# Patient Record
Sex: Female | Born: 1940 | Race: White | Hispanic: No | Marital: Single | Smoking: Never smoker
Health system: Southern US, Community
[De-identification: ages and names within clinical notes are randomized; demographics above are authoritative.]

## PROBLEM LIST (undated history)

## (undated) DIAGNOSIS — I10 Essential (primary) hypertension: Secondary | ICD-10-CM

## (undated) DIAGNOSIS — E119 Type 2 diabetes mellitus without complications: Secondary | ICD-10-CM

## (undated) DIAGNOSIS — E785 Hyperlipidemia, unspecified: Secondary | ICD-10-CM

## (undated) DIAGNOSIS — K5120 Ulcerative (chronic) proctitis without complications: Secondary | ICD-10-CM

## (undated) DIAGNOSIS — C801 Malignant (primary) neoplasm, unspecified: Secondary | ICD-10-CM

## (undated) HISTORY — PX: ABDOMINAL HYSTERECTOMY: SHX81

## (undated) HISTORY — DX: Type 2 diabetes mellitus without complications: E11.9

## (undated) HISTORY — PX: TONSILLECTOMY AND ADENOIDECTOMY: SUR1326

## (undated) HISTORY — DX: Malignant (primary) neoplasm, unspecified: C80.1

## (undated) HISTORY — DX: Hyperlipidemia, unspecified: E78.5

## (undated) HISTORY — DX: Ulcerative (chronic) proctitis without complications: K51.20

## (undated) HISTORY — DX: Essential (primary) hypertension: I10

---

## 2013-11-11 DIAGNOSIS — C801 Malignant (primary) neoplasm, unspecified: Secondary | ICD-10-CM

## 2013-11-11 HISTORY — DX: Malignant (primary) neoplasm, unspecified: C80.1

## 2016-11-21 LAB — BASIC METABOLIC PANEL
BUN: 10 (ref 4–21)
Creatinine: 0.6 (ref 0.5–1.1)
GLUCOSE: 122
Potassium: 3.9 (ref 3.4–5.3)
SODIUM: 128 — AB (ref 137–147)

## 2016-11-21 LAB — HEPATIC FUNCTION PANEL
ALK PHOS: 67 (ref 25–125)
ALT: 17 (ref 7–35)
AST: 23 (ref 13–35)
BILIRUBIN, TOTAL: 0.3

## 2016-11-21 LAB — CBC AND DIFFERENTIAL
HEMATOCRIT: 40 (ref 36–46)
HEMOGLOBIN: 13.4 (ref 12.0–16.0)
Platelets: 215 (ref 150–399)
WBC: 28.3

## 2017-06-11 ENCOUNTER — Ambulatory Visit (INDEPENDENT_AMBULATORY_CARE_PROVIDER_SITE_OTHER): Payer: Medicare PPO | Admitting: Family Medicine

## 2017-06-11 ENCOUNTER — Encounter: Payer: Self-pay | Admitting: Family Medicine

## 2017-06-11 VITALS — BP 129/77 | HR 65 | Ht 64.5 in | Wt 166.0 lb

## 2017-06-11 DIAGNOSIS — K219 Gastro-esophageal reflux disease without esophagitis: Secondary | ICD-10-CM | POA: Diagnosis not present

## 2017-06-11 DIAGNOSIS — E1169 Type 2 diabetes mellitus with other specified complication: Secondary | ICD-10-CM | POA: Insufficient documentation

## 2017-06-11 DIAGNOSIS — C9110 Chronic lymphocytic leukemia of B-cell type not having achieved remission: Secondary | ICD-10-CM

## 2017-06-11 DIAGNOSIS — K635 Polyp of colon: Secondary | ICD-10-CM | POA: Diagnosis not present

## 2017-06-11 DIAGNOSIS — K5190 Ulcerative colitis, unspecified, without complications: Secondary | ICD-10-CM | POA: Insufficient documentation

## 2017-06-11 DIAGNOSIS — Z90710 Acquired absence of both cervix and uterus: Secondary | ICD-10-CM | POA: Diagnosis not present

## 2017-06-11 DIAGNOSIS — K51019 Ulcerative (chronic) pancolitis with unspecified complications: Secondary | ICD-10-CM

## 2017-06-11 DIAGNOSIS — I10 Essential (primary) hypertension: Secondary | ICD-10-CM | POA: Diagnosis not present

## 2017-06-11 DIAGNOSIS — Z1159 Encounter for screening for other viral diseases: Secondary | ICD-10-CM

## 2017-06-11 DIAGNOSIS — E119 Type 2 diabetes mellitus without complications: Secondary | ICD-10-CM | POA: Diagnosis not present

## 2017-06-11 DIAGNOSIS — Z139 Encounter for screening, unspecified: Secondary | ICD-10-CM | POA: Diagnosis not present

## 2017-06-11 DIAGNOSIS — C9190 Lymphoid leukemia, unspecified not having achieved remission: Secondary | ICD-10-CM | POA: Diagnosis not present

## 2017-06-11 DIAGNOSIS — E782 Mixed hyperlipidemia: Secondary | ICD-10-CM | POA: Diagnosis not present

## 2017-06-11 DIAGNOSIS — R5383 Other fatigue: Secondary | ICD-10-CM | POA: Diagnosis not present

## 2017-06-11 DIAGNOSIS — Z114 Encounter for screening for human immunodeficiency virus [HIV]: Secondary | ICD-10-CM

## 2017-06-11 DIAGNOSIS — E785 Hyperlipidemia, unspecified: Secondary | ICD-10-CM

## 2017-06-11 DIAGNOSIS — E1159 Type 2 diabetes mellitus with other circulatory complications: Secondary | ICD-10-CM | POA: Insufficient documentation

## 2017-06-11 DIAGNOSIS — Z13820 Encounter for screening for osteoporosis: Secondary | ICD-10-CM

## 2017-06-11 MED ORDER — METFORMIN HCL 500 MG PO TABS: 500.0000 mg | ORAL_TABLET | Freq: Two times a day (BID) | ORAL | 0 refills | Status: DC

## 2017-06-11 NOTE — Patient Instructions (Addendum)
Please call your medical insurance and find out which glucometer they recommend you used to check her diabetes.  And then please let us know next time you come in for fasting labs and then the medical assistant can call in the prescription for that glucometer for you.   Come in in the near future for fasting blood work.  If you would like, you can make a follow-up with me a week or so afterwards to discuss the results and this is the first time you or reviewing blood work with me.      Diabetes Mellitus and Standards of Medical Care Managing diabetes (diabetes mellitus) can be complicated. Your diabetes treatment may be managed by a team of health care providers, including:  A diet and nutrition specialist (registered dietitian).  A nurse.  A certified diabetes educator (CDE).  A diabetes specialist (endocrinologist).  An eye doctor.  A primary care provider.  A dentist.  Your health care providers follow a schedule in order to help you get the best quality of care. The following schedule is a general guideline for your diabetes management plan. Your health care providers may also give you more specific instructions. HbA1c ( hemoglobin A1c) test This test provides information about blood sugar (glucose) control over the previous 2-3 months. It is used to check whether your diabetes management plan needs to be adjusted.  If you are meeting your treatment goals, this test is done at least 2 times a year.  If you are not meeting treatment goals or if your treatment goals have changed, this test is done 4 times a year.  Blood pressure test  This test is done at every routine medical visit. For most people, the goal is less than 130/80. Ask your health care provider what your goal blood pressure should be. Dental and eye exams  Visit your dentist two times a year.  If you have type 1 diabetes, get an eye exam 3-5 years after you are diagnosed, and then once a year after your  first exam. ? If you were diagnosed with type 1 diabetes as a child, get an eye exam when you are age 8 or older and have had diabetes for 3-5 years. After the first exam, you should get an eye exam once a year.  If you have type 2 diabetes, have an eye exam as soon as you are diagnosed, and then once a year after your first exam. Foot care exam  Visual foot exams are done at every routine medical visit. The exams check for cuts, bruises, redness, blisters, sores, or other problems with the feet.  A complete foot exam is done by your health care provider once a year. This exam includes an inspection of the structure and skin of your feet, and a check of the pulses and sensation in your feet. ? Type 1 diabetes: Get your first exam 3-5 years after diagnosis. ? Type 2 diabetes: Get your first exam as soon as you are diagnosed.  Check your feet every day for cuts, bruises, redness, blisters, or sores. If you have any of these or other problems that are not healing, contact your health care provider. Kidney function test ( urine microalbumin)  This test is done once a year. ? Type 1 diabetes: Get your first test 5 years after diagnosis. ? Type 2 diabetes: Get your first test as soon as you are diagnosed.  If you have chronic kidney disease (CKD), get a serum creatinine and estimated glomerular  filtration rate (eGFR) test once a year. Lipid profile (cholesterol, HDL, LDL, triglycerides)  This test should be done when you are diagnosed with diabetes, and every 5 years after the first test. If you are on medicines to lower your cholesterol, you may need to get this test done every year. ? The goal for LDL is less than 100 mg/dL (5.5 mmol/L). If you are at high risk, the goal is less than 70 mg/dL (3.9 mmol/L). ? The goal for HDL is 40 mg/dL (2.2 mmol/L) for men and 50 mg/dL(2.8 mmol/L) for women. An HDL cholesterol of 60 mg/dL (3.3 mmol/L) or higher gives some protection against heart  disease. ? The goal for triglycerides is less than 150 mg/dL (8.3 mmol/L). Immunizations  The yearly flu (influenza) vaccine is recommended for everyone 6 months or older who has diabetes.  The pneumonia (pneumococcal) vaccine is recommended for everyone 2 years or older who has diabetes. If you are 92 or older, you may get the pneumonia vaccine as a series of two separate shots.  The hepatitis B vaccine is recommended for adults shortly after they have been diagnosed with diabetes.  The Tdap (tetanus, diphtheria, and pertussis) vaccine should be given: ? According to normal childhood vaccination schedules, for children. ? Every 10 years, for adults who have diabetes.  The shingles vaccine is recommended for people who have had chicken pox and are 50 years or older. Mental and emotional health  Screening for symptoms of eating disorders, anxiety, and depression is recommended at the time of diagnosis and afterward as needed. If your screening shows that you have symptoms (you have a positive screening result), you may need further evaluation and be referred to a mental health care provider. Diabetes self-management education  Education about how to manage your diabetes is recommended at diagnosis and ongoing as needed. Treatment plan  Your treatment plan will be reviewed at every medical visit. Summary  Managing diabetes (diabetes mellitus) can be complicated. Your diabetes treatment may be managed by a team of health care providers.  Your health care providers follow a schedule in order to help you get the best quality of care.  Standards of care including having regular physical exams, blood tests, blood pressure monitoring, immunizations, screening tests, and education about how to manage your diabetes.  Your health care providers may also give you more specific instructions based on your individual health. This information is not intended to replace advice given to you by your  health care provider. Make sure you discuss any questions you have with your health care provider. Document Released: 08/25/2009 Document Revised: 07/26/2016 Document Reviewed: 07/26/2016 Elsevier Interactive Patient Education  2018 [place].      Blood Glucose Monitoring, Adult Monitoring your blood sugar (glucose) helps you manage your diabetes. It also helps you and your health care provider determine how well your diabetes management plan is working. Blood glucose monitoring involves checking your blood glucose as often as directed, and keeping a record (log) of your results over time. Why should I monitor my blood glucose? Checking your blood glucose regularly can:  Help you understand how food, exercise, illnesses, and medicines affect your blood glucose.  Let you know what your blood glucose is at any time. You can quickly tell if you are having low blood glucose (hypoglycemia) or high blood glucose (hyperglycemia).  Help you and your health care provider adjust your medicines as needed.  When should I check my blood glucose? Follow instructions from your health  care provider about how often to check your blood glucose. This may depend on:  The type of diabetes you have.  How well-controlled your diabetes is.  Medicines you are taking.  If you have type 1 diabetes:  Check your blood glucose at least 2 times a day.  Also check your blood glucose: ? Before every insulin injection. ? Before and after exercise. ? Between meals. ? 2 hours after a meal. ? Occasionally between 2:00 a.m. and 3:00 a.m., as directed. ? Before potentially dangerous tasks, like driving or using heavy machinery. ? At bedtime.  You may need to check your blood glucose more often, up to 6-10 times a day: ? If you use an insulin pump. ? If you need multiple daily injections (MDI). ? If your diabetes is not well-controlled. ? If you are ill. ? If you have a history of severe  hypoglycemia. ? If you have a history of not knowing when your blood glucose is getting low (hypoglycemia unawareness). If you have type 2 diabetes:  If you take insulin or other diabetes medicines, check your blood glucose at least 2 times a day.  If you are on intensive insulin therapy, check your blood glucose at least 4 times a day. Occasionally, you may also need to check between 2:00 a.m. and 3:00 a.m., as directed.  Also check your blood glucose: ? Before and after exercise. ? Before potentially dangerous tasks, like driving or using heavy machinery.  You may need to check your blood glucose more often if: ? Your medicine is being adjusted. ? Your diabetes is not well-controlled. ? You are ill. What is a blood glucose log?  A blood glucose log is a record of your blood glucose readings. It helps you and your health care provider: ? Look for patterns in your blood glucose over time. ? Adjust your diabetes management plan as needed.  Every time you check your blood glucose, write down your result and notes about things that may be affecting your blood glucose, such as your diet and exercise for the day.  Most glucose meters store a record of glucose readings in the meter. Some meters allow you to download your records to a computer. How do I check my blood glucose? Follow these steps to get accurate readings of your blood glucose: Supplies needed   Blood glucose meter.  Test strips for your meter. Each meter has its own strips. You must use the strips that come with your meter.  A needle to prick your finger (lancet). Do not use lancets more than once.  A device that holds the lancet (lancing device).  A journal or log book to write down your results. Procedure  Wash your hands with soap and water.  Prick the side of your finger (not the tip) with the lancet. Use a different finger each time.  Gently rub the finger until a small drop of blood appears.  Follow  instructions that come with your meter for inserting the test strip, applying blood to the strip, and using your blood glucose meter.  Write down your result and any notes. Alternative testing sites  Some meters allow you to use areas of your body other than your finger (alternative sites) to test your blood.  If you think you may have hypoglycemia, or if you have hypoglycemia unawareness, do not use alternative sites. Use your finger instead.  Alternative sites may not be as accurate as the fingers, because blood flow is slower in these  areas. This means that the result you get may be delayed, and it may be different from the result that you would get from your finger.  The most common alternative sites are: ? Forearm. ? Thigh. ? Palm of the hand. Additional tips  Always keep your supplies with you.  If you have questions or need help, all blood glucose meters have a 24-hour "hotline" number that you can call. You may also contact your health care provider.  After you use a few boxes of test strips, adjust (calibrate) your blood glucose meter by following instructions that came with your meter. This information is not intended to replace advice given to you by your health care provider. Make sure you discuss any questions you have with your health care provider. Document Released: 10/31/2003 Document Revised: 05/17/2016 Document Reviewed: 04/08/2016 Elsevier Interactive Patient Education  2017 [employer].

## 2017-06-11 NOTE — Progress Notes (Signed)
New patient office visit note:  Impression and Recommendations:    1. Encounter for medical screening examination   2. Diabetes mellitus without complication ([place])   3. Hypertension, unspecified type   4. Mixed hyperlipidemia   5. CLL (chronic lymphocytic leukemia) ([place])   6. Ulcerative pancolitis with complication ([place])   7. Polyp of colon, unspecified part of colon, unspecified type   8. Gastroesophageal reflux disease, esophagitis presence not specified   9. S/P hysterectomy- about 11yr ago   10. Osteoporosis screening   11. Other fatigue   12. Need for hepatitis C screening test   13. Screening for HIV without presence of risk factors      No problem-specific Assessment & Plan notes found for this encounter.   The patient was counseled, risk factors were discussed, anticipatory guidance given.   New Prescriptions   ALCOHOL SWABS (B-D SINGLE USE SWABS REGULAR) PADS    1 Package by Does not apply route 2 (two) times daily.   BLOOD GLUCOSE CALIBRATION (ACCU-CHEK AVIVA) SOLN    1 drop by In Vitro route 2 (two) times daily.   BLOOD GLUCOSE MONITORING SUPPL (ACCU-CHEK AVIVA) DEVICE    Use as instructed   CALCIUM CARBONATE-VITAMIN D 600-400 MG-UNIT TABLET    Take 1 tablet by mouth 2 (two) times daily. ( take 1200 calcium per day)   CHOLECALCIFEROL (VITAMIN D3) 5000 UNITS TABS    5,000 IU OTC vitamin D3 daily.   GLUCOSE BLOOD (ACCU-CHEK AVIVA) TEST STRIP    Use as instructed   LANCET DEVICE MISC    1 Device by Does not apply route 2 (two) times daily.   VITAMIN D, ERGOCALCIFEROL, (DRISDOL) 50000 UNITS CAPS CAPSULE    Take 1 capsule (50,000 Units total) by mouth every 7 (seven) days.    Meds ordered this encounter  Medications  . DISCONTD: metFORMIN (GLUCOPHAGE) 500 MG tablet    Sig: Take 1 tablet (500 mg total) by mouth 2 (two) times daily with a meal.    Dispense:  60 tablet    Refill:  0  . DISCONTD: metFORMIN (GLUCOPHAGE) 500 MG tablet    Sig: Take 1 tablet (500 mg  total) by mouth 2 (two) times daily with a meal.    Dispense:  60 tablet    Refill:  0    Discontinued Medications   METFORMIN (GLUCOPHAGE) 500 MG TABLET    Take 500 mg by mouth 2 (two) times daily with a meal.    Modified Medications   Modified Medication Previous Medication   ATORVASTATIN (LIPITOR) 10 MG TABLET atorvastatin (LIPITOR) 10 MG tablet      Take 1 tablet (10 mg total) by mouth daily.    Take 10 mg by mouth daily.   HYDROCHLOROTHIAZIDE (HYDRODIURIL) 25 MG TABLET hydrochlorothiazide (HYDRODIURIL) 25 MG tablet      Take 1 tablet (25 mg total) by mouth daily.    Take 25 mg by mouth daily.   LOSARTAN (COZAAR) 100 MG TABLET losartan (COZAAR) 100 MG tablet      Take 1 tablet (100 mg total) by mouth daily.    Take 100 mg by mouth daily.   METFORMIN (GLUCOPHAGE) 500 MG TABLET metFORMIN (GLUCOPHAGE) 500 MG tablet      Take 0.5 tablets (250 mg total) by mouth 2 (two) times daily with a meal. Need OV prior to further RF    Take 0.5 tablets (250 mg total) by mouth 2 (two) times daily with a meal.  Orders Placed This Encounter  Procedures  . CBC with Differential/Platelet  . Comprehensive metabolic panel  . Hemoglobin A1c  . Hepatitis C antibody  . HIV antibody  . Lipid panel  . T4, free  . TSH  . VITAMIN D 25 Hydroxy (Vit-D Deficiency, Fractures)  . Ambulatory referral to Hematology / Oncology  . Ambulatory referral to Gastroenterology     Gross side effects, risk and benefits, and alternatives of medications discussed with patient.  Patient is aware that all medications have potential side effects and we are unable to predict every side effect or drug-drug interaction that may occur.  Expresses verbal understanding and consents to current therapy plan and treatment regimen.  Return for bldwrk near future; then f/up OV to discuss per pt prefer.  Please see AVS handed out to patient at the end of our visit for further patient instructions/ counseling done pertaining to  today's office visit.    Note: This document was prepared using Dragon voice recognition software and may include unintentional dictation errors.  ----------------------------------------------------------------------------------------------------------------------    Subjective:    Chief complaint:   Chief Complaint  Patient presents with  . Establish Care     HPI: Mackenzie Barber is a pleasant 76 y.o. female who presents to [facility] today to review their medical history with me and establish care.   I asked the patient to review their chronic problem list with me to ensure everything was updated and accurate.    All recent office visits with other providers, any medical records that patient brought in etc  - I reviewed today.     Also asked pt to get me medical records from [facility] providers/ specialists that they had seen within the past 3-5 years- if they are in private practice and/or do not work for a [employer], [facility], [facility], [facility] or [employer] owned practice.  Told them to call their specialists to clarify this if they are not sure.    Problem  Hypertension Associated With Diabetes (Hcc)   HTn meds for about 59yr now.  For approximately 2 years her doctor then added the losartan to her hydrochlorothiazide.  BP well controlled at home.    Hyperlipidemia Associated With Type 2 Diabetes Mellitus (Hcc)   lipitor about 2 yrs ago.        Wt Readings from Last 3 Encounters:  11/20/17 161 lb 14.4 oz (73.4 kg)  08/14/17 158 lb (71.7 kg)  06/24/17 159 lb 8 oz (72.3 kg)   BP Readings from Last 3 Encounters:  11/20/17 135/75  10/29/17 105/65  08/14/17 (!) 141/67   Pulse Readings from Last 3 Encounters:  11/20/17 65  10/29/17 71  08/14/17 71   BMI Readings from Last 3 Encounters:  11/20/17 26.94 kg/m  08/14/17 26.29 kg/m  06/24/17 26.96 kg/m    Patient Care Team    Relationship Specialty Notifications Start End  OMellody Dance  DO PCP - General Family Medicine  06/11/17     Patient Active Problem List   Diagnosis Date Noted  . Diabetes mellitus without complication ([place] 038/18/2993   Priority: High  . Hypertension associated with diabetes ([place] 06/11/2017    Priority: Medium  . Hyperlipidemia associated with type 2 diabetes mellitus ([place] 06/11/2017    Priority: Medium  . Ulcerative colitis ([facility] 06/11/2017    Priority: Medium  . CLL (chronic lymphocytic leukemia) ([place] 06/11/2017    Priority: Low  . Polyp of colon 06/11/2017  Priority: Low  . Vitamin D insufficiency 06/24/2017  . GERD (gastroesophageal reflux disease) 06/11/2017  . S/P hysterectomy- about 65yr ago 06/11/2017  . Other fatigue 06/11/2017  . Osteoporosis screening 06/11/2017     Past Medical History:  Diagnosis Date  . Diabetes mellitus without complication ([place]   . Hyperlipidemia   . Hypertension      Past Medical History:  Diagnosis Date  . Diabetes mellitus without complication ([place]   . Hyperlipidemia   . Hypertension      Past Surgical History:  Procedure Laterality Date  . ABDOMINAL HYSTERECTOMY       Family History  Problem Relation Age of Onset  . Cancer Mother        colon  . Hyperlipidemia Mother   . Hypertension Mother      Social History   Substance and Sexual Activity  Drug Use No     Social History   Substance and Sexual Activity  Alcohol Use No     Social History   Tobacco Use  Smoking Status Never Smoker  Smokeless Tobacco Never Used     Outpatient Encounter Medications as of 06/11/2017  Medication Sig  . balsalazide (COLAZAL) 750 MG capsule Take 2,250 mg by mouth 3 (three) times daily.  . [DISCONTINUED] atorvastatin (LIPITOR) 10 MG tablet Take 10 mg by mouth daily.  . [DISCONTINUED] hydrochlorothiazide (HYDRODIURIL) 25 MG tablet Take 25 mg by mouth daily.  . [DISCONTINUED] losartan (COZAAR) 100 MG tablet Take 100 mg by mouth daily.  . [DISCONTINUED] metFORMIN (GLUCOPHAGE)  500 MG tablet Take 500 mg by mouth 2 (two) times daily with a meal.  . [DISCONTINUED] metFORMIN (GLUCOPHAGE) 500 MG tablet Take 1 tablet (500 mg total) by mouth 2 (two) times daily with a meal.  . [DISCONTINUED] metFORMIN (GLUCOPHAGE) 500 MG tablet Take 1 tablet (500 mg total) by mouth 2 (two) times daily with a meal.   No facility-administered encounter medications on file as of 06/11/2017.     Allergies: Patient has no known allergies.   ROS   Objective:   Blood pressure 129/77, pulse 65, height 5' 4.5" (1.638 m), weight 166 lb (75.3 kg). Body mass index is 28.05 kg/m. General: Well Developed, well nourished, and in no acute distress.  Neuro: Alert and oriented x3, extra-ocular muscles intact, sensation grossly intact.  HEENT:[place]/AT, PERRLA, neck supple, No carotid bruits Skin: no gross rashes  Cardiac: Regular rate and rhythm Respiratory: Essentially clear to auscultation bilaterally. Not using accessory muscles, speaking in full sentences.  Abdominal: not grossly distended Musculoskeletal: Ambulates w/o diff, FROM * 4 ext.  Vasc: less 2 sec cap RF, warm and pink  Psych:  No HI/SI, judgement and insight good, Euthymic mood. Full Affect.    No results found for this or any previous visit (from the past 2160 hour(s)).

## 2017-06-16 ENCOUNTER — Other Ambulatory Visit: Payer: Medicare PPO

## 2017-06-16 DIAGNOSIS — E119 Type 2 diabetes mellitus without complications: Secondary | ICD-10-CM

## 2017-06-16 DIAGNOSIS — E782 Mixed hyperlipidemia: Secondary | ICD-10-CM

## 2017-06-16 DIAGNOSIS — K51019 Ulcerative (chronic) pancolitis with unspecified complications: Secondary | ICD-10-CM

## 2017-06-16 DIAGNOSIS — Z114 Encounter for screening for human immunodeficiency virus [HIV]: Secondary | ICD-10-CM

## 2017-06-16 DIAGNOSIS — R5383 Other fatigue: Secondary | ICD-10-CM

## 2017-06-16 DIAGNOSIS — I10 Essential (primary) hypertension: Secondary | ICD-10-CM

## 2017-06-16 DIAGNOSIS — Z1159 Encounter for screening for other viral diseases: Secondary | ICD-10-CM

## 2017-06-16 DIAGNOSIS — Z13820 Encounter for screening for osteoporosis: Secondary | ICD-10-CM

## 2017-06-16 DIAGNOSIS — C9110 Chronic lymphocytic leukemia of B-cell type not having achieved remission: Secondary | ICD-10-CM

## 2017-06-16 DIAGNOSIS — Z139 Encounter for screening, unspecified: Secondary | ICD-10-CM

## 2017-06-17 LAB — CBC WITH DIFFERENTIAL/PLATELET
Basophils Absolute: 0.1 10*3/uL (ref 0.0–0.2)
Basos: 0 %
EOS (ABSOLUTE): 0.3 10*3/uL (ref 0.0–0.4)
Eos: 1 %
HEMATOCRIT: 39.2 % (ref 34.0–46.6)
HEMOGLOBIN: 13.3 g/dL (ref 11.1–15.9)
IMMATURE GRANULOCYTES: 0 %
Immature Grans (Abs): 0 10*3/uL (ref 0.0–0.1)
Lymphocytes Absolute: 21 10*3/uL — ABNORMAL HIGH (ref 0.7–3.1)
Lymphs: 78 %
MCH: 29.8 pg (ref 26.6–33.0)
MCHC: 33.9 g/dL (ref 31.5–35.7)
MCV: 88 fL (ref 79–97)
MONOS ABS: 0.5 10*3/uL (ref 0.1–0.9)
Monocytes: 2 %
NEUTROS PCT: 19 %
Neutrophils Absolute: 4.9 10*3/uL (ref 1.4–7.0)
Platelets: 213 10*3/uL (ref 150–379)
RBC: 4.47 x10E6/uL (ref 3.77–5.28)
RDW: 13.1 % (ref 12.3–15.4)
WBC: 26.8 10*3/uL — AB (ref 3.4–10.8)

## 2017-06-17 LAB — COMPREHENSIVE METABOLIC PANEL
A/G RATIO: 2 (ref 1.2–2.2)
ALBUMIN: 4.2 g/dL (ref 3.5–4.8)
ALK PHOS: 74 IU/L (ref 39–117)
ALT: 11 IU/L (ref 0–32)
AST: 16 IU/L (ref 0–40)
BILIRUBIN TOTAL: 0.4 mg/dL (ref 0.0–1.2)
BUN / CREAT RATIO: 13 (ref 12–28)
BUN: 8 mg/dL (ref 8–27)
CHLORIDE: 94 mmol/L — AB (ref 96–106)
CO2: 25 mmol/L (ref 20–29)
Calcium: 9.4 mg/dL (ref 8.7–10.3)
Creatinine, Ser: 0.64 mg/dL (ref 0.57–1.00)
GFR calc Af Amer: 102 mL/min/{1.73_m2} (ref >59)
GFR calc non Af Amer: 88 mL/min/{1.73_m2} (ref >59)
GLOBULIN, TOTAL: 2.1 g/dL (ref 1.5–4.5)
Glucose: 100 mg/dL — ABNORMAL HIGH (ref 65–99)
Potassium: 4.2 mmol/L (ref 3.5–5.2)
SODIUM: 134 mmol/L (ref 134–144)
Total Protein: 6.3 g/dL (ref 6.0–8.5)

## 2017-06-17 LAB — LIPID PANEL
CHOLESTEROL TOTAL: 147 mg/dL (ref 100–199)
Chol/HDL Ratio: 3.4 ratio (ref 0.0–4.4)
HDL: 43 mg/dL (ref >39)
LDL CALC: 73 mg/dL (ref 0–99)
TRIGLYCERIDES: 156 mg/dL — AB (ref 0–149)
VLDL Cholesterol Cal: 31 mg/dL (ref 5–40)

## 2017-06-17 LAB — VITAMIN D 25 HYDROXY (VIT D DEFICIENCY, FRACTURES): VIT D 25 HYDROXY: 28.6 ng/mL — AB (ref 30.0–100.0)

## 2017-06-17 LAB — T4, FREE: Free T4: 1.33 ng/dL (ref 0.82–1.77)

## 2017-06-17 LAB — HIV ANTIBODY (ROUTINE TESTING W REFLEX): HIV SCREEN 4TH GENERATION: NONREACTIVE

## 2017-06-17 LAB — HEMOGLOBIN A1C
Est. average glucose Bld gHb Est-mCnc: 128 mg/dL
HEMOGLOBIN A1C: 6.1 % — AB (ref 4.8–5.6)

## 2017-06-17 LAB — HEPATITIS C ANTIBODY: Hep C Virus Ab: <0.1 s/co ratio (ref 0.0–0.9)

## 2017-06-17 LAB — TSH: TSH: 2.24 u[IU]/mL (ref 0.450–4.500)

## 2017-06-24 ENCOUNTER — Ambulatory Visit (INDEPENDENT_AMBULATORY_CARE_PROVIDER_SITE_OTHER): Payer: Medicare PPO | Admitting: Family Medicine

## 2017-06-24 ENCOUNTER — Encounter: Payer: Self-pay | Admitting: Family Medicine

## 2017-06-24 VITALS — BP 132/66 | HR 69 | Ht 64.5 in | Wt 159.5 lb

## 2017-06-24 DIAGNOSIS — E119 Type 2 diabetes mellitus without complications: Secondary | ICD-10-CM

## 2017-06-24 DIAGNOSIS — E782 Mixed hyperlipidemia: Secondary | ICD-10-CM

## 2017-06-24 DIAGNOSIS — E559 Vitamin D deficiency, unspecified: Secondary | ICD-10-CM

## 2017-06-24 MED ORDER — CALCIUM CARBONATE-VITAMIN D 600-400 MG-UNIT PO TABS: 1.0000 | ORAL_TABLET | Freq: Two times a day (BID) | ORAL | 11 refills | Status: DC

## 2017-06-24 MED ORDER — VITAMIN D (ERGOCALCIFEROL) 1.25 MG (50000 UNIT) PO CAPS: 50000.0000 [IU] | ORAL_CAPSULE | ORAL | 10 refills | Status: DC

## 2017-06-24 MED ORDER — VITAMIN D3 125 MCG (5000 UT) PO TABS: ORAL_TABLET | ORAL | 3 refills | Status: DC

## 2017-06-24 MED ORDER — METFORMIN HCL 500 MG PO TABS: 250.0000 mg | ORAL_TABLET | Freq: Two times a day (BID) | ORAL | 1 refills | Status: DC

## 2017-06-24 NOTE — Patient Instructions (Addendum)
4 your low vitamin D I'm giving new a once weekly tablet that she pick up at the pharmacy to take in addition to your daily vitamin D supplements.  Once your daily 2000s are gone please go and purchase a 5000 international units vitamin D3 to take daily along with the weekly vitamin D.  We should recheck this in 6 months  Because your A1c is 6.1 and your having occasional episodes of a couple hours after you eat a feeling "off" we will decrease your metformin to one half tablet in the morning and one half tablet in the p.m.    We will recheck your A1c in approximately 4 months  Please call Mellissa,  of my medical assistant and let her know which glucometer your insurance prefers once you know, so that she can put the order in for glucometers test strips and lancets.  You can check your fasting blood sugars meaning first thing in the morning approximately 2-3 times per week and also 2 hours after largest meal 2-3 times per week.  Try to check it when you eat especially healthy when you eat especially poorly.  Also if you feel bad or funny during the day please remember check your blood pressure and blood sugar.       Blood Glucose Monitoring, Adult Monitoring your blood sugar (glucose) helps you manage your diabetes. It also helps you and your health care provider determine how well your diabetes management plan is working. Blood glucose monitoring involves checking your blood glucose as often as directed, and keeping a record (log) of your results over time. Why should I monitor my blood glucose? Checking your blood glucose regularly can:  Help you understand how food, exercise, illnesses, and medicines affect your blood glucose.  Let you know what your blood glucose is at any time. You can quickly tell if you are having low blood glucose (hypoglycemia) or high blood glucose (hyperglycemia).  Help you and your health care provider adjust your medicines as needed.  When should I check my  blood glucose? Follow instructions from your health care provider about how often to check your blood glucose. This may depend on:  The type of diabetes you have.  How well-controlled your diabetes is.  Medicines you are taking.  If you have type 1 diabetes:  Check your blood glucose at least 2 times a day.  Also check your blood glucose: ? Before every insulin injection. ? Before and after exercise. ? Between meals. ? 2 hours after a meal. ? Occasionally between 2:00 a.m. and 3:00 a.m., as directed. ? Before potentially dangerous tasks, like driving or using heavy machinery. ? At bedtime.  You may need to check your blood glucose more often, up to 6-10 times a day: ? If you use an insulin pump. ? If you need multiple daily injections (MDI). ? If your diabetes is not well-controlled. ? If you are ill. ? If you have a history of severe hypoglycemia. ? If you have a history of not knowing when your blood glucose is getting low (hypoglycemia unawareness). If you have type 2 diabetes:  If you take insulin or other diabetes medicines, check your blood glucose at least 2 times a day.  If you are on intensive insulin therapy, check your blood glucose at least 4 times a day. Occasionally, you may also need to check between 2:00 a.m. and 3:00 a.m., as directed.  Also check your blood glucose: ? Before and after exercise. ? Before potentially  dangerous tasks, like driving or using heavy machinery.  You may need to check your blood glucose more often if: ? Your medicine is being adjusted. ? Your diabetes is not well-controlled. ? You are ill. What is a blood glucose log?  A blood glucose log is a record of your blood glucose readings. It helps you and your health care provider: ? Look for patterns in your blood glucose over time. ? Adjust your diabetes management plan as needed.  Every time you check your blood glucose, write down your result and notes about things that may be  affecting your blood glucose, such as your diet and exercise for the day.  Most glucose meters store a record of glucose readings in the meter. Some meters allow you to download your records to a computer. How do I check my blood glucose? Follow these steps to get accurate readings of your blood glucose: Supplies needed   Blood glucose meter.  Test strips for your meter. Each meter has its own strips. You must use the strips that come with your meter.  A needle to prick your finger (lancet). Do not use lancets more than once.  A device that holds the lancet (lancing device).  A journal or log book to write down your results. Procedure  Wash your hands with soap and water.  Prick the side of your finger (not the tip) with the lancet. Use a different finger each time.  Gently rub the finger until a small drop of blood appears.  Follow instructions that come with your meter for inserting the test strip, applying blood to the strip, and using your blood glucose meter.  Write down your result and any notes. Alternative testing sites  Some meters allow you to use areas of your body other than your finger (alternative sites) to test your blood.  If you think you may have hypoglycemia, or if you have hypoglycemia unawareness, do not use alternative sites. Use your finger instead.  Alternative sites may not be as accurate as the fingers, because blood flow is slower in these areas. This means that the result you get may be delayed, and it may be different from the result that you would get from your finger.  The most common alternative sites are: ? Forearm. ? Thigh. ? Palm of the hand. Additional tips  Always keep your supplies with you.  If you have questions or need help, all blood glucose meters have a 24-hour "hotline" number that you can call. You may also contact your health care provider.  After you use a few boxes of test strips, adjust (calibrate) your blood glucose  meter by following instructions that came with your meter. This information is not intended to replace advice given to you by your health care provider. Make sure you discuss any questions you have with your health care provider. Document Released: 10/31/2003 Document Revised: 05/17/2016 Document Reviewed: 04/08/2016 Elsevier Interactive Patient Education  2017 [employer].

## 2017-06-24 NOTE — Progress Notes (Signed)
Assessment and plan:  1. Vitamin D insufficiency   2. Diabetes mellitus without complication ([place])   3. Mixed hyperlipidemia    4 your low vitamin D I'm giving new a once weekly tablet that she pick up at the pharmacy to take in addition to your daily vitamin D supplements.  Once your daily 2000s are gone please go and purchase a 5000 international units vitamin D3 to take daily along with the weekly vitamin D.  We should recheck this in 6 months  Because your A1c is 6.1 and your having occasional episodes of a couple hours after you eat a feeling "off" we will decrease your metformin to one half tablet in the morning and one half tablet in the p.m.    We will recheck your A1c in approximately 4 months  Please call Mellissa,  of my medical assistant and let her know which glucometer your insurance prefers once you know, so that she can put the order in for glucometers test strips and lancets.  You can check your fasting blood sugars meaning first thing in the morning approximately 2-3 times per week and also 2 hours after largest meal 2-3 times per week.  Try to check it when you eat especially healthy when you eat especially poorly.  Also if you feel bad or funny during the day please remember check your blood pressure and blood sugar.  Meds ordered this encounter  Medications  . Vitamin D, Ergocalciferol, (DRISDOL) 50000 units CAPS capsule    Sig: Take 1 capsule (50,000 Units total) by mouth every 7 (seven) days.    Dispense:  12 capsule    Refill:  10  . Cholecalciferol (VITAMIN D3) 5000 units TABS    Sig: 5,000 IU OTC vitamin D3 daily.    Dispense:  90 tablet    Refill:  3  . Calcium Carbonate-Vitamin D 600-400 MG-UNIT tablet    Sig: Take 1 tablet by mouth 2 (two) times daily. ( take 1200 calcium per day)    Dispense:  60 tablet    Refill:  11  . DISCONTD: metFORMIN (GLUCOPHAGE) 500 MG tablet    Sig: Take 0.5  tablets (250 mg total) by mouth 2 (two) times daily with a meal.    Dispense:  180 tablet    Refill:  1    Modified Medications   Modified Medication Previous Medication   ATORVASTATIN (LIPITOR) 10 MG TABLET atorvastatin (LIPITOR) 10 MG tablet      Take 1 tablet (10 mg total) by mouth daily.    Take 10 mg by mouth daily.   HYDROCHLOROTHIAZIDE (HYDRODIURIL) 25 MG TABLET hydrochlorothiazide (HYDRODIURIL) 25 MG tablet      Take 1 tablet (25 mg total) by mouth daily.    Take 25 mg by mouth daily.   LOSARTAN (COZAAR) 100 MG TABLET losartan (COZAAR) 100 MG tablet      Take 1 tablet (100 mg total) by mouth daily.    Take 100 mg by mouth daily.   METFORMIN (GLUCOPHAGE) 500 MG TABLET metFORMIN (GLUCOPHAGE) 500 MG tablet      Take 0.5 tablets (250 mg  total) by mouth 2 (two) times daily with a meal. Need OV prior to further RF    Take 1 tablet (500 mg total) by mouth 2 (two) times daily with a meal.     No orders of the defined types were placed in this encounter.    Return for 1mo f/up reck A1c since dec metfromin; and adding vit D- reck Vit D.  Anticipatory guidance and routine counseling done re: condition, txmnt options and need for follow up. All questions of patient's were answered.   Gross side effects, risk and benefits, and alternatives of medications discussed with patient.  Patient is aware that all medications have potential side effects and we are unable to predict every sideeffect or drug-drug interaction that may occur.  Expresses verbal understanding and consents to current therapy plan and treatment regiment.  Please see AVS handed out to patient at the end of our visit for additional patient instructions/ counseling done pertaining to today's office visit.  Note: This document was prepared using Dragon voice recognition software and may include unintentional dictation  errors.   ----------------------------------------------------------------------------------------------------------------------  Subjective:   CC:   Mackenzie GAUTNEYis a 76y.o. female [facility] and discussion of recent bloodwork that was done.  1. All recent blood work that we ordered was reviewed with patient today.  Patient was counseled on all abnormalities and we discussed dietary and lifestyle changes that could help those values (also medications when appropriate).  Extensive health counseling performed and all patient's concerns/ questions were addressed.     Wt Readings from Last 3 Encounters:  11/20/17 161 lb 14.4 oz (73.4 kg)  08/14/17 158 lb (71.7 kg)  06/24/17 159 lb 8 oz (72.3 kg)   BP Readings from Last 3 Encounters:  11/20/17 135/75  10/29/17 105/65  08/14/17 (!) 141/67   Pulse Readings from Last 3 Encounters:  11/20/17 65  10/29/17 71  08/14/17 71   BMI Readings from Last 3 Encounters:  11/20/17 26.94 kg/m  08/14/17 26.29 kg/m  06/24/17 26.96 kg/m     Patient Care Team    Relationship Specialty Notifications Start End  OMellody Dance DO PCP - General Family Medicine  06/11/17     Full medical history updated and reviewed in the office today  Patient Active Problem List   Diagnosis Date Noted  . Diabetes mellitus without complication ([place] 070/[id number]   Priority: High  . Hypertension associated with diabetes ([place] 06/11/2017    Priority: Medium  . Hyperlipidemia associated with type 2 diabetes mellitus ([place] 06/11/2017    Priority: Medium  . Ulcerative colitis ([facility] 06/11/2017    Priority: Medium  . CLL (chronic lymphocytic leukemia) ([facility] 06/11/2017    Priority: Low  . Polyp of colon 06/11/2017    Priority: Low  . Vitamin D insufficiency 06/24/2017  . GERD (gastroesophageal reflux disease) 06/11/2017  . S/P hysterectomy- about [place] 06/11/2017  . Other fatigue 06/11/2017   . Osteoporosis screening 06/11/2017    Past Medical History:  Diagnosis Date  . Diabetes mellitus without complication ([place]  . Hyperlipidemia   . Hypertension     Past Surgical History:  Procedure Laterality Date  . ABDOMINAL HYSTERECTOMY      Social History   Tobacco Use  . Smoking status: Never Smoker  . Smokeless tobacco: Never Used  Substance Use Topics  . Alcohol use: No    Family Hx: Family History  Problem  Relation Age of Onset  . Cancer Mother        colon  . Hyperlipidemia Mother   . Hypertension Mother      Medications: Current Outpatient Medications  Medication Sig Dispense Refill  . balsalazide (COLAZAL) 750 MG capsule Take 2,250 mg by mouth 3 (three) times daily.    . Alcohol Swabs (B-D SINGLE USE SWABS REGULAR) PADS 1 Package by Does not apply route 2 (two) times daily. 100 each 0  . atorvastatin (LIPITOR) 10 MG tablet Take 1 tablet (10 mg total) by mouth daily. 90 tablet 1  . Blood Glucose Calibration (ACCU-CHEK AVIVA) SOLN 1 drop by In Vitro route 2 (two) times daily. 1 each 11  . Blood Glucose Monitoring Suppl (ACCU-CHEK AVIVA) device Use as instructed 1 each 0  . Calcium Carbonate-Vitamin D 600-400 MG-UNIT tablet Take 1 tablet by mouth 2 (two) times daily. ( take 1200 calcium per day) 60 tablet 11  . Cholecalciferol (VITAMIN D3) 5000 units TABS 5,000 IU OTC vitamin D3 daily. 90 tablet 3  . glucose blood (ACCU-CHEK AVIVA) test strip Use as instructed 100 each 12  . hydrochlorothiazide (HYDRODIURIL) 25 MG tablet Take 1 tablet (25 mg total) by mouth daily. 90 tablet 1  . Lancet Device MISC 1 Device by Does not apply route 2 (two) times daily. 1 each 11  . losartan (COZAAR) 100 MG tablet Take 1 tablet (100 mg total) by mouth daily. 90 tablet 1  . metFORMIN (GLUCOPHAGE) 500 MG tablet Take 0.5 tablets (250 mg total) by mouth 2 (two) times daily with a meal. Need OV prior to further RF 90 tablet 0  . Vitamin D, Ergocalciferol, (DRISDOL) 50000 units CAPS  capsule Take 1 capsule (50,000 Units total) by mouth every 7 (seven) days. 12 capsule 10   No current facility-administered medications for this visit.     Allergies:  No Known Allergies   Review of Systems: General:   No F/C, wt loss Pulm:   No DIB, SOB, pleuritic chest pain Card:  No CP, palpitations Abd:  No n/v/d or pain Ext:  No inc edema from baseline  Objective:  Blood pressure 132/66, pulse 69, height 5' 4.5" (1.638 m), weight 159 lb 8 oz (72.3 kg). Body mass index is 26.96 kg/m. Gen:   Well NAD, A and O *3 HEENT:    [place]/AT, EOMI,  MMM Lungs:   Normal work of breathing. CTA B/L, no Wh, rhonchi Heart:   RRR, S1, S2 WNL's, no MRG Abd:   No gross distention Exts:    warm, pink,  Brisk capillary refill, warm and well perfused.  Psych:    No HI/SI, judgement and insight good, Euthymic mood. Full Affect.   No results found for this or any previous visit (from the past 2160 hour(s)).

## 2017-07-03 ENCOUNTER — Other Ambulatory Visit: Payer: Self-pay

## 2017-07-03 DIAGNOSIS — E7849 Other hyperlipidemia: Secondary | ICD-10-CM

## 2017-07-03 DIAGNOSIS — I10 Essential (primary) hypertension: Secondary | ICD-10-CM

## 2017-07-03 NOTE — Telephone Encounter (Signed)
Pharmacy sent refill request for Hydrochlorothiazide, Atorvastatin, and Losartan - we have not filled through our office at this time.  Sent to Dr Raliegh Scarlet for review.  MPulliam, CMA/RT(R)

## 2017-07-04 MED ORDER — LOSARTAN POTASSIUM 100 MG PO TABS: 100.0000 mg | ORAL_TABLET | Freq: Every day | ORAL | 1 refills | Status: DC

## 2017-07-04 MED ORDER — ATORVASTATIN CALCIUM 10 MG PO TABS: 10.0000 mg | ORAL_TABLET | Freq: Every day | ORAL | 1 refills | Status: DC

## 2017-07-04 MED ORDER — HYDROCHLOROTHIAZIDE 25 MG PO TABS: 25.0000 mg | ORAL_TABLET | Freq: Every day | ORAL | 1 refills | Status: DC

## 2017-07-07 ENCOUNTER — Other Ambulatory Visit: Payer: Self-pay

## 2017-07-07 MED ORDER — GLUCOSE BLOOD VI STRP: ORAL_STRIP | 12 refills | Status: DC

## 2017-07-07 MED ORDER — ACCU-CHEK AVIVA DEVI: 0 refills | Status: AC

## 2017-07-07 MED ORDER — ACCU-CHEK AVIVA VI SOLN: 1.0000 [drp] | Freq: Two times a day (BID) | 11 refills | Status: AC

## 2017-07-07 MED ORDER — BD SWAB SINGLE USE REGULAR PADS: 1.0000 | MEDICATED_PAD | Freq: Two times a day (BID) | 0 refills | Status: AC

## 2017-07-07 MED ORDER — LANCET DEVICE MISC: 1.0000 | Freq: Two times a day (BID) | 11 refills | Status: DC

## 2017-07-07 NOTE — Telephone Encounter (Signed)
Pharmacy sent request for 90 day supply for Balsalazide.  Patient has not had this refilled by our office yet, sent to Dr Raliegh Scarlet to review and sign off. MPulliam, CMA/RT(R)

## 2017-07-08 NOTE — Telephone Encounter (Signed)
Called patient and notified and informed that balsalazide needs to be filled by her GI physician.

## 2017-07-10 ENCOUNTER — Ambulatory Visit: Payer: Medicare PPO | Admitting: Hematology & Oncology

## 2017-07-10 ENCOUNTER — Ambulatory Visit: Payer: Medicare PPO

## 2017-07-10 ENCOUNTER — Other Ambulatory Visit: Payer: Medicare PPO

## 2017-07-15 ENCOUNTER — Other Ambulatory Visit: Payer: Self-pay

## 2017-07-15 DIAGNOSIS — E119 Type 2 diabetes mellitus without complications: Secondary | ICD-10-CM

## 2017-07-15 NOTE — Telephone Encounter (Signed)
Pharmacy sent over refill request for balsalazide.  Medication was last filled by a previous provider, sent request for Dr. Raliegh Scarlet to review and sign.  MPulliam, CMA/RT(R)

## 2017-07-16 MED ORDER — METFORMIN HCL 500 MG PO TABS: 250.0000 mg | ORAL_TABLET | Freq: Two times a day (BID) | ORAL | 0 refills | Status: DC

## 2017-07-29 ENCOUNTER — Ambulatory Visit: Payer: Medicare PPO | Admitting: Hematology & Oncology

## 2017-07-29 ENCOUNTER — Other Ambulatory Visit: Payer: Medicare PPO

## 2017-08-14 ENCOUNTER — Encounter: Payer: Self-pay | Admitting: Family Medicine

## 2017-08-14 ENCOUNTER — Other Ambulatory Visit (HOSPITAL_BASED_OUTPATIENT_CLINIC_OR_DEPARTMENT_OTHER): Payer: Medicare PPO

## 2017-08-14 ENCOUNTER — Ambulatory Visit (HOSPITAL_BASED_OUTPATIENT_CLINIC_OR_DEPARTMENT_OTHER): Payer: Medicare PPO | Admitting: Hematology & Oncology

## 2017-08-14 VITALS — BP 141/67 | HR 71 | Temp 98.3°F | Resp 16 | Ht 65.0 in | Wt 158.0 lb

## 2017-08-14 DIAGNOSIS — Z800 Family history of malignant neoplasm of digestive organs: Secondary | ICD-10-CM

## 2017-08-14 DIAGNOSIS — C9110 Chronic lymphocytic leukemia of B-cell type not having achieved remission: Secondary | ICD-10-CM

## 2017-08-14 DIAGNOSIS — E119 Type 2 diabetes mellitus without complications: Secondary | ICD-10-CM | POA: Diagnosis not present

## 2017-08-14 DIAGNOSIS — K5190 Ulcerative colitis, unspecified, without complications: Secondary | ICD-10-CM | POA: Diagnosis not present

## 2017-08-14 LAB — CBC WITH DIFFERENTIAL (CANCER CENTER ONLY)
BASO#: 0.1 10e3/uL (ref 0.0–0.2)
BASO%: 0.2 % (ref 0.0–2.0)
EOS%: 0.9 % (ref 0.0–7.0)
Eosinophils Absolute: 0.3 10e3/uL (ref 0.0–0.5)
HCT: 39.9 % (ref 34.8–46.6)
HGB: 13.6 g/dL (ref 11.6–15.9)
LYMPH#: 23 10e3/uL — ABNORMAL HIGH (ref 0.9–3.3)
LYMPH%: 77.1 % — ABNORMAL HIGH (ref 14.0–48.0)
MCH: 30.5 pg (ref 26.0–34.0)
MCHC: 34.1 g/dL (ref 32.0–36.0)
MCV: 90 fL (ref 81–101)
MONO#: 0.7 10e3/uL (ref 0.1–0.9)
MONO%: 2.2 % (ref 0.0–13.0)
NEUT#: 5.9 10e3/uL (ref 1.5–6.5)
NEUT%: 19.6 % — ABNORMAL LOW (ref 39.6–80.0)
Platelets: 232 10e3/uL (ref 145–400)
RBC: 4.46 10e6/uL (ref 3.70–5.32)
RDW: 12.8 % (ref 11.1–15.7)
WBC: 29.9 10e3/uL — ABNORMAL HIGH (ref 3.9–10.0)

## 2017-08-14 LAB — TECHNOLOGIST REVIEW CHCC SATELLITE

## 2017-08-14 LAB — CMP (CANCER CENTER ONLY)
ALK PHOS: 73 U/L (ref 26–84)
ALT: 18 U/L (ref 10–47)
AST: 23 U/L (ref 11–38)
Albumin: 3.9 g/dL (ref 3.3–5.5)
BUN: 8 mg/dL (ref 7–22)
CALCIUM: 9.7 mg/dL (ref 8.0–10.3)
CHLORIDE: 101 meq/L (ref 98–108)
CO2: 28 mEq/L (ref 18–33)
Creat: 0.8 mg/dl (ref 0.6–1.2)
GLUCOSE: 109 mg/dL (ref 73–118)
POTASSIUM: 3.7 meq/L (ref 3.3–4.7)
Sodium: 137 mEq/L (ref 128–145)
Total Bilirubin: 0.6 mg/dl (ref 0.20–1.60)
Total Protein: 7.3 g/dL (ref 6.4–8.1)

## 2017-08-14 LAB — CHCC SATELLITE - SMEAR

## 2017-08-14 NOTE — Progress Notes (Signed)
Referral MD  Reason for Referral: CLL-stage A   Chief Complaint  Patient presents with  . New Patient (Initial Visit)  : I just moved here from [place].  HPI: Mackenzie Barber is a very charming 76 year old white female. She is from [place]. Her daughter lives in the [place] area. Her daughter wanted her to move here. As such, she is now living in [place].  She has a diagnosis of CLL. She was diagnosed probably about 5-6 years ago. She has been seeing Dr. Lajuana Matte who is out of the oncology group at [facility]. She has had CLL for several years. She has never required any therapy.  She does have other health issues. She does have ulcerative colitis. She does have diabetes.  Thankfully, she's had no issues with the CLL. She's had no bleeding or bruising. She's had no fever. There's been no swollen lymphadenopathy. She's had occasional nausea. She's had no vomiting.  There is some diarrhea. There is no bloody diarrhea.  She sees the hematologist every 6 months.  She's had no recent change in medications.  She was calmly referred to the [place] by her family doctor, Dr. Mellody Dance.  She has not had a mammogram for a couple years.  There is no family history of blood problems. She has a family history of diabetes.  Overall, her performance status is ECOG 0.    Past Medical History:  Diagnosis Date  . Diabetes mellitus without complication ([place])   . Hyperlipidemia   . Hypertension   :  Past Surgical History:  Procedure Laterality Date  . ABDOMINAL HYSTERECTOMY    :   Current Outpatient Prescriptions:  .  Alcohol Swabs (B-D SINGLE USE SWABS REGULAR) PADS, 1 Package by Does not apply route 2 (two) times daily., Disp: 100 each, Rfl: 0 .  atorvastatin (LIPITOR) 10 MG tablet, Take 1 tablet (10 mg total) by mouth daily., Disp: 90 tablet, Rfl: 1 .  balsalazide (COLAZAL) 750 MG capsule, Take 2,250 mg by mouth 3 (three) times daily., Disp: , Rfl:   .  Blood Glucose Calibration (ACCU-CHEK AVIVA) SOLN, 1 drop by In Vitro route 2 (two) times daily., Disp: 1 each, Rfl: 11 .  Blood Glucose Monitoring Suppl (ACCU-CHEK AVIVA) device, Use as instructed, Disp: 1 each, Rfl: 0 .  Calcium Carbonate-Vitamin D 600-400 MG-UNIT tablet, Take 1 tablet by mouth 2 (two) times daily. ( take 1200 calcium per day), Disp: 60 tablet, Rfl: 11 .  Cholecalciferol (VITAMIN D3) 5000 units TABS, 5,000 IU OTC vitamin D3 daily., Disp: 90 tablet, Rfl: 3 .  glucose blood (ACCU-CHEK AVIVA) test strip, Use as instructed, Disp: 100 each, Rfl: 12 .  hydrochlorothiazide (HYDRODIURIL) 25 MG tablet, Take 1 tablet (25 mg total) by mouth daily., Disp: 90 tablet, Rfl: 1 .  Lancet Device MISC, 1 Device by Does not apply route 2 (two) times daily., Disp: 1 each, Rfl: 11 .  losartan (COZAAR) 100 MG tablet, Take 1 tablet (100 mg total) by mouth daily., Disp: 90 tablet, Rfl: 1 .  metFORMIN (GLUCOPHAGE) 500 MG tablet, Take 0.5 tablets (250 mg total) by mouth 2 (two) times daily with a meal. Need OV prior to further RF, Disp: 90 tablet, Rfl: 0 .  Vitamin D, Ergocalciferol, (DRISDOL) 50000 units CAPS capsule, Take 1 capsule (50,000 Units total) by mouth every 7 (seven) days., Disp: 12 capsule, Rfl: 10:  :  No Known Allergies:  Family History  Problem Relation Age of Onset  . Cancer  Mother        colon  . Hyperlipidemia Mother   . Hypertension Mother   :  Social History   Social History  . Marital status: Unknown    Spouse name: N/A  . Number of children: N/A  . Years of education: N/A   Occupational History  . Not on file.   Social History Main Topics  . Smoking status: Never Smoker  . Smokeless tobacco: Never Used  . Alcohol use No  . Drug use: No  . Sexual activity: No   Other Topics Concern  . Not on file   Social History Narrative  . No narrative on file  :  Pertinent items are noted in HPI.  Exam:Well-developed and well-nourished white female in no obvious  distress. Vital signs are temperature of 98.3. Pulse 71. Blood pressure 141/67. Weight is 158 pounds. Head and neck exam shows no ocular or oral lesions. There are no palpable cervical or supraclavicular lymph nodes. Lungs are clear bilaterally. Cardiac exam regular rate and rhythm with no murmurs, rubs or bruits. Axillary exam shows no bilateral axillary adenopathy. Abdomen is soft. She is mildly obese. She has good bowel sounds. There is no fluid wave. There is no abdominal mass. There is no palpable liver or spleen tip. She has no bilateral inguinal adenopathy. Back exam shows no tenderness over the spine, ribs or hips. Extremities shows no clubbing, cyanosis or edema. She has good range of motion of her joints. Skin exam shows no rashes, ecchymoses or petechia. Neurological exam shows no focal neurological deficits.     Recent Labs  08/14/17 1216  WBC 29.9*  HGB 13.6  HCT 39.9  PLT 232    Recent Labs  08/14/17 1216  NA 137  K 3.7  CL 101  CO2 28  GLUCOSE 109  BUN 8  CREATININE 0.8  CALCIUM 9.7    Blood smear review: Normochromic and normocytic palpation of red blood cells. She has no nucleated red blood cells. I see note teardrop cells. She has no rouleau formation. There are no inclusion bodies. I see no target cells. Her white cells are increased in number. She does have mature lymphocytes. There may be a couple lymphoplasmacytic white blood cells. She has no hypersegmented polys. Platelets are adequate in number and size.   Pathology: None     Assessment and Plan:  Mackenzie Barber is a very nice 76 year old white female. She has CLL. I would have say that she has stage A CLL. I don't see anything on exam that would suggest any therapy is necessary. She is not anemic or thrombocytopenic. I don't see any lymphadenopathy. There is no splenomegaly.  I think that we can follow her along in 6 months. I think this would be very reasonable.  She always knows that she can come and see  [place] sooner if she has any problems.  I spent about 50 minutes with her. I reviewed all of her labs. She obviously is incredibly attuned to her disease. She saw a very good hematologist in [place] who clearly did a wonderful job in educating her.

## 2017-08-15 LAB — IGG, IGA, IGM
IGA/IMMUNOGLOBULIN A, SERUM: 161 mg/dL (ref 64–422)
IGM (IMMUNOGLOBIN M), SRM: 17 mg/dL — AB (ref 26–217)
IgG, Qn, Serum: 877 mg/dL (ref 700–1600)

## 2017-08-18 LAB — PROTEIN ELECTROPHORESIS, SERUM, WITH REFLEX
A/G RATIO SPE: 1.2 (ref 0.7–1.7)
ALBUMIN: 3.8 g/dL (ref 2.9–4.4)
ALPHA 2: 0.8 g/dL (ref 0.4–1.0)
Alpha 1: 0.3 g/dL (ref 0.0–0.4)
Beta: 1.1 g/dL (ref 0.7–1.3)
GLOBULIN, TOTAL: 3.2 g/dL (ref 2.2–3.9)
Gamma Globulin: 1 g/dL (ref 0.4–1.8)
Total Protein: 7 g/dL (ref 6.0–8.5)

## 2017-10-23 ENCOUNTER — Ambulatory Visit: Payer: Medicare PPO | Admitting: Family Medicine

## 2017-10-23 ENCOUNTER — Ambulatory Visit: Payer: Medicare PPO

## 2017-10-29 ENCOUNTER — Ambulatory Visit (INDEPENDENT_AMBULATORY_CARE_PROVIDER_SITE_OTHER): Payer: Medicare PPO

## 2017-10-29 VITALS — BP 105/65 | HR 71

## 2017-10-29 DIAGNOSIS — Z23 Encounter for immunization: Secondary | ICD-10-CM

## 2017-10-29 NOTE — Progress Notes (Signed)
Pt here for influenza vaccine.  Screening questionnaire reviewed, VIS provided to patient, and any/all patient questions answered.  Pt states that she has leukemia.  Per Dr. Raliegh Scarlet, no contraindications to pt receiving flu vaccine.  Charyl Bigger, CMA

## 2017-11-20 ENCOUNTER — Encounter: Payer: Self-pay | Admitting: Family Medicine

## 2017-11-20 ENCOUNTER — Ambulatory Visit: Payer: Medicare PPO | Admitting: Family Medicine

## 2017-11-20 VITALS — BP 135/75 | HR 65 | Ht 65.0 in | Wt 161.9 lb

## 2017-11-20 DIAGNOSIS — E1169 Type 2 diabetes mellitus with other specified complication: Secondary | ICD-10-CM | POA: Diagnosis not present

## 2017-11-20 DIAGNOSIS — L989 Disorder of the skin and subcutaneous tissue, unspecified: Secondary | ICD-10-CM | POA: Diagnosis not present

## 2017-11-20 DIAGNOSIS — E1159 Type 2 diabetes mellitus with other circulatory complications: Secondary | ICD-10-CM | POA: Diagnosis not present

## 2017-11-20 DIAGNOSIS — E559 Vitamin D deficiency, unspecified: Secondary | ICD-10-CM

## 2017-11-20 DIAGNOSIS — Z7189 Other specified counseling: Secondary | ICD-10-CM

## 2017-11-20 DIAGNOSIS — I10 Essential (primary) hypertension: Secondary | ICD-10-CM | POA: Diagnosis not present

## 2017-11-20 DIAGNOSIS — E785 Hyperlipidemia, unspecified: Secondary | ICD-10-CM

## 2017-11-20 DIAGNOSIS — E119 Type 2 diabetes mellitus without complications: Secondary | ICD-10-CM | POA: Diagnosis not present

## 2017-11-20 DIAGNOSIS — Z1283 Encounter for screening for malignant neoplasm of skin: Secondary | ICD-10-CM

## 2017-11-20 NOTE — Progress Notes (Signed)
Impression and Recommendations:    1. Hyperkeratotic Skin lesion of right leg- changing recently   2. Diabetes mellitus without complication ([place])   3. Hypertension associated with diabetes ([place])   4. Hyperlipidemia associated with type 2 diabetes mellitus ([place])   5. Vitamin D insufficiency   6. Screening exam for skin cancer   7. Counseling on health promotion and disease prevention     Dermatologic - Ambulatory referral to dermatology for hyperkaratotic lesion on her right leg, changing recently. Prefers female physician, and going to [facility] if possible.  DM - Last OV 5-6 months ago, her A1c was 6.1. We decreased her metformin to 1/2 tablet in the morning and 1/2 tablet at night. She is tolerating the new dose well.  - To review the use of her glucometer and other diabetes tools, we recommended that the patient make a nurse-only visit for counseling with our medical assistants. She was also notified that the pharmacy can assist her with counseling as well.  - cont current meds  HLD:  Continue current medications.  Fasting with a profile check less than 6 months ago.  Will consider next office visit check  HTN:  Blood pressure well controlled today.  Continue current medications.  Vitamin D Deficiency - Reviewed with the patient that she should be taking calcium+Vitamin D, the 5,000 IU OTC, and then take the prescription 50,000 once weekly Vitamin D as well.  - The patient was educated at length about the way she should be taking her Vitamin D prescriptions and supplements. Additional educational materials were provided to the patient with regards to the risks of Vitamin D Deficiency and the benefits of correcting it.  - She was advised to continue drinking plenty of water, ideally half of her own body weight in ounces of water. The importance of adequate hydration, especially in relation to tolerating her meds, was reviewed in detail with the patient.  - The  importance of regular physical activity was reviewed with the patient. Advised patient to continue with her regular walking exercise of at least 30-40 minutes as often as possible.  - Blood pressure today is fantastic. We will re-check her Vitamin D and A1c today.  - She will return for OV in four months.   Orders Placed This Encounter  Procedures  . Hemoglobin A1c  . VITAMIN D 25 Hydroxy (Vit-D Deficiency, Fractures)  . Ambulatory referral to Dermatology    Gross side effects, risk and benefits, and alternatives of medications and treatment plan in general discussed with patient.  Patient is aware that all medications have potential side effects and we are unable to predict every side effect or drug-drug interaction that may occur.   Patient will call with any questions prior to using medication if they have concerns.  Expresses verbal understanding and consents to current therapy and treatment regimen.  No barriers to understanding were identified.  Red flag symptoms and signs discussed in detail.  Patient expressed understanding regarding what to do in case of emergency\urgent symptoms  Please see AVS handed out to patient at the end of our visit for further patient instructions/ counseling done pertaining to today's office visit.   Return for 55mof/up DM, BP, CHol. .    Note: This note was prepared with assistance of Dragon voice recognition software. Occasional wrong-word or sound-a-like substitutions may have occurred due to the inherent limitations of voice recognition software.    This document serves as a record of services  personally performed by Mellody Dance, DO. It was created on her behalf by Toni Amend, a trained medical scribe. The creation of this record is based on the scribe's personal observations and the provider's statements to them.   I have reviewed the above medical documentation for accuracy and completeness and I concur.  Mellody Dance 11/20/17 1:26 PM   --------------------------------------------------------------------------------------------------------------------------------------------------------------------------------------------------------------------------------------------    Subjective:     HPI: Mackenzie Barber is a 77 y.o. female who presents to [facility] today for issues as discussed below.   Diabetes Mellitus Last OV in August, her A1c was 6.1, so her metformin was decreased to 1/2 tablet in the morning and 1/2 tablet at night.  Since changing her dosage, she denies increased thirst or urination. She doesn't feel like her sugars are going high, and denies other adverse side-effects.  She has not been regularly checking her blood sugar at home, because she isn't entirely sure how to check it.  In September, she was sent a glucometer and other tools by her insurance company. She would like some counseling to review how to use it.  She obtains her medications at [facility].  HLD She takes Lipitor every night. She denies aches and pains in her muscles, aside from those caused by old age.  She drinks a lot of water, noting five or six big glasses every day.  Vitamin D Deficiency She denies any issues with her Vitamin D supplementation, other than accidentally not taking it as prescribed.  In the past, she took 12-2998 units Vitamin D. She took one prescription of the prescribed 50,000 once-a-week dosage, and took the other Vitamin D's as prescribed, but then she didn't get the "really high" Vitamin D prescription refilled. She has been taking the 5,000 IU dosage, plus the others. She acknowledges a misunderstanding about how she was meant to be taking her Vitamin D.  Dermatological Concerns She is concerned about a bump on her right leg. The lesion on her skin had been there for four or five years, but was always flat in the past. It just started "growing out"  about a 3-4 months ago. She began noticing it becoming larger, and raised.  Otherwise, no other major concerns or problems today.  For exercise, she often goes for walks, and typically walks for about 30-40 minutes at a time. In the wintry weather, she has not been walking every day.  She confirms a little swelling around her ankles, which has been present for years w/o change   Wt Readings from Last 3 Encounters:  11/20/17 161 lb 14.4 oz (73.4 kg)  08/14/17 158 lb (71.7 kg)  06/24/17 159 lb 8 oz (72.3 kg)   BP Readings from Last 3 Encounters:  11/20/17 135/75  10/29/17 105/65  08/14/17 (!) 141/67   Pulse Readings from Last 3 Encounters:  11/20/17 65  10/29/17 71  08/14/17 71   BMI Readings from Last 3 Encounters:  11/20/17 26.94 kg/m  08/14/17 26.29 kg/m  06/24/17 26.96 kg/m     Patient Care Team    Relationship Specialty Notifications Start End  Mellody Dance, DO PCP - General Family Medicine  06/11/17      Patient Active Problem List   Diagnosis Date Noted  . Diabetes mellitus without complication ([place]) 82/99/3716    Priority: High  . Hypertension associated with diabetes ([place]) 06/11/2017    Priority: Medium  . Hyperlipidemia associated with type 2 diabetes mellitus ([place]) 06/11/2017  Priority: Medium  . Ulcerative colitis ([place]) 06/11/2017    Priority: Medium  . CLL (chronic lymphocytic leukemia) ([place]) 06/11/2017    Priority: Low  . Polyp of colon 06/11/2017    Priority: Low  . Counseling on health promotion and disease prevention 11/20/2017  . Hyperkeratotic Skin lesion of right leg- changing recently 11/20/2017  . Vitamin D insufficiency 06/24/2017  . GERD (gastroesophageal reflux disease) 06/11/2017  . S/P hysterectomy- about 61yr ago 06/11/2017  . Other fatigue 06/11/2017  . Osteoporosis screening 06/11/2017    Past Medical history, Surgical history, Family history, Social history, Allergies and Medications have been entered into the medical  record, reviewed and changed as needed.    Current Meds  Medication Sig  . Alcohol Swabs (B-D SINGLE USE SWABS REGULAR) PADS 1 Package by Does not apply route 2 (two) times daily.  .Marland Kitchenatorvastatin (LIPITOR) 10 MG tablet Take 1 tablet (10 mg total) by mouth daily.  . balsalazide (COLAZAL) 750 MG capsule Take 2,250 mg by mouth 3 (three) times daily.  . Blood Glucose Calibration (ACCU-CHEK AVIVA) SOLN 1 drop by In Vitro route 2 (two) times daily.  . Blood Glucose Monitoring Suppl (ACCU-CHEK AVIVA) device Use as instructed  . Calcium Carbonate-Vitamin D 600-400 MG-UNIT tablet Take 1 tablet by mouth 2 (two) times daily. ( take 1200 calcium per day)  . Cholecalciferol (VITAMIN D3) 5000 units TABS 5,000 IU OTC vitamin D3 daily.  .Marland Kitchenglucose blood (ACCU-CHEK AVIVA) test strip Use as instructed  . hydrochlorothiazide (HYDRODIURIL) 25 MG tablet Take 1 tablet (25 mg total) by mouth daily.  .Elmore GuiseDevice MISC 1 Device by Does not apply route 2 (two) times daily.  .Marland Kitchenlosartan (COZAAR) 100 MG tablet Take 1 tablet (100 mg total) by mouth daily.  . metFORMIN (GLUCOPHAGE) 500 MG tablet Take 0.5 tablets (250 mg total) by mouth 2 (two) times daily with a meal. Need OV prior to further RF  . Vitamin D, Ergocalciferol, (DRISDOL) 50000 units CAPS capsule Take 1 capsule (50,000 Units total) by mouth every 7 (seven) days.    Allergies:  No Known Allergies   Review of Systems:  A fourteen system review of systems was performed and found to be positive as per HPI.   Objective:   Blood pressure 135/75, pulse 65, height 5' 5"  (1.651 m), weight 161 lb 14.4 oz (73.4 kg), SpO2 97 %. Body mass index is 26.94 kg/m. General:  Well Developed, well nourished, appropriate for stated age.  Neuro:  Alert and oriented,  extra-ocular muscles intact  HEENT:  Normocephalic, atraumatic, neck supple, no carotid bruits appreciated  Skin: Right distal anterior thigh, hyperkeratotic skin lesion about 3 mm diameter, scabby surface,  slightly raised at the base, and erythematous. No gross rash, warm, pink. Cardiac:  RRR, S1 S2 Respiratory:  ECTA B/L and A/P, Not using accessory muscles, speaking in full sentences- unlabored. Vascular:  Ext warm, no cyanosis apprec.; cap RF less 2 sec. 2+ non-pitting edema, bilateral ankles. Psych:  No HI/SI, judgement and insight good, Euthymic mood. Full Affect.

## 2017-11-20 NOTE — Patient Instructions (Addendum)
For your glucometer teaching please call the office or when you leave today make an appointment for a medical assistant only appointment so that 1 of our CMA's can teach you how to use it or you are more than welcome to ask your pharmacist which they were are very well schooled in this and know how to teach people how to use them as well.  We referred you to dermatology if you have not heard from anyone by mid next week please call and speak with Dorothea Ogle who is our referral clerk.  Thank you    Preventive Care for Adults  A healthy lifestyle and preventive care can promote health and wellness. Preventive health guidelines for men include the following key practices:  .   A routine yearly physical is a good way to check with your health care provider about your health and preventative screening. It is a chance to share any concerns and updates on your health and to receive a thorough exam.  .  Visit your dentist for a routine exam and preventative care every 6 months. Brush your teeth twice a day and floss once a day. Good oral hygiene prevents tooth decay and gum disease.  .  The frequency of eye exams is based on your age, health, family medical history, use of contact lenses, and other factors.  Follow your health care provider's recommendations for frequency of eye exams.  .  Eat a healthy diet.  Foods such as vegetables, fruits, whole grains, low-fat dairy products, and lean protein foods contain the nutrients you need without too many calories.  Decrease your intake of foods high in solid fats, added sugars, and salt.  Eat the right amount of calories for you.  Get information about a proper diet from your health care provider, if necessary.  .  Regular physical exercise is one of the most important things you can do for your health.  Most adults should get at least 150 minutes of moderate-intensity exercise (any activity that increases your heart rate and causes you to sweat) each week.   In addition, most adults need muscle-strengthening exercises on 2 or more days a week.  .  Maintain a healthy weight. The body mass index (BMI) is a screening tool to identify possible weight problems. It provides an estimate of body fat based on height and weight. Your health care provider can find your BMI and can help you achieve or maintain a healthy weight. For adults 20 years and older: A BMI below 18.5 is considered underweight. A BMI of 18.5 to 24.9 is normal. A BMI of 25 to 29.9 is considered overweight. A BMI of 30 and above is considered obese.  .  Maintain normal blood lipids and cholesterol levels by exercising and minimizing your intake of saturated fat. Eat a balanced diet with plenty of fruit and vegetables. Blood tests for lipids and cholesterol should begin at age 31 and be repeated every 5 years. If your lipid or cholesterol levels are high, you are over 50, or you are at high risk for heart disease, you may need your cholesterol levels checked more frequently. Ongoing high lipid and cholesterol levels should be treated with medicines if diet and exercise are not working.  .  If you smoke, find out from your health care provider how to quit. If you do not use tobacco, do not start.  . If you choose to drink alcohol, do not have more than 2 drinks per day. One drink is  considered to be 12 ounces (355 mL) of beer, 5 ounces (148 mL) of wine, or 1.5 ounces (44 mL) of liquor.  Marland Kitchen Avoid use of street drugs. Do not share needles with anyone. Ask for help if you need support or instructions about stopping the use of drugs.  . High blood pressure causes heart disease and increases the risk of stroke. Your blood pressure should be checked at least every 1-2 years. Ongoing high blood pressure should be treated with medicines, if weight loss and exercise are not effective.  . If you are 35-52 years old, ask your health care provider if you should take aspirin to prevent heart  disease.  . Diabetes screening involves taking a blood sample to check your fasting blood sugar level.  This should be done once every 3 years, after age 42, if you are within normal weight and without risk factors for diabetes.  Testing should be considered at a younger age or be carried out more frequently if you are overweight and have at least 1 risk factor for diabetes.  . Colorectal cancer can be detected and often prevented. Most routine colorectal cancer screening begins at the age of 50 and continues through age 70. However, your health care provider may recommend screening at an earlier age if you have risk factors for colon cancer. On a yearly basis, your health care provider may provide home test kits to check for hidden blood in the stool. Use of a small camera at the end of a tube to directly examine the colon (sigmoidoscopy or colonoscopy) can detect the earliest forms of colorectal cancer. Talk to your health care provider about this at age 28, when routine screening begins. Direct exam of the colon should be repeated every 5-10 years through age 26, unless early forms of precancerous polyps or small growths are found.  .  Lung cancer screening is recommended for adults aged 40-80 years who are at high risk for developing lung cancer because of a history of smoking. A yearly low-dose CT scan of the lungs is recommended for people who have at least a 30-pack-year history of smoking and are a current smoker or have quit within the past 15 years. A pack year of smoking is smoking an average of 1 pack of cigarettes a day for 1 year (for example: 1 pack a day for 30 years or 2 packs a day for 15 years). Yearly screening should continue until the smoker has stopped smoking for at least 15 years. Yearly screening should be stopped for people who develop a health problem that would prevent them from having lung cancer treatment.  . Talk with your health care provider about prostate cancer  screening.  . Testicular cancer screening is recommended for adult males. Screening includes self-exam and a health care provider exam. Consult with your health care provider about any symptoms you have or any concerns you have about testicular cancer.  . Use sunscreen. Apply sunscreen liberally and repeatedly throughout the day. You should seek shade when your shadow is shorter than you. Protect yourself by wearing long sleeves, pants, a wide-brimmed hat, and sunglasses year round, whenever you are outdoors.  . Once a month, do a whole-body skin exam, using a mirror to look at the skin on your back. Tell your health care provider about new moles, moles that have irregular borders, moles that are larger than a pencil eraser, or moles that have changed in shape or color.    ++++++++++++++++++++++++++++++++++++++++++++++++++++++++++++++++++  Stay  current with required vaccines (immunizations).  ? Influenza vaccine. All adults should be immunized every year.  ? Tetanus, diphtheria, and acellular pertussis (Td, Tdap) vaccine. An adult who has not previously received Tdap or who does not know his vaccine status should receive 1 dose of Tdap. This initial dose should be followed by tetanus and diphtheria toxoids (Td) booster doses every 10 years. Adults with an unknown or incomplete history of completing a 3-dose immunization series with Td-containing vaccines should begin or complete a primary immunization series including a Tdap dose. Adults should receive a Td booster every 10 years.  ? Varicella vaccine. An adult without evidence of immunity to varicella should receive 2 doses or a second dose if he has previously received 1 dose.  ? Human papillomavirus (HPV) vaccine. Males aged 6-21 years who have not received the vaccine previously should receive the 3-dose series. Males aged 22-26 years may be immunized. Immunization is recommended through the age of 45 years for any female who has sex  with males and did not get any or all doses earlier. Immunization is recommended for any person with an immunocompromised condition through the age of 48 years if he did not get any or all doses earlier. During the 3-dose series, the second dose should be obtained 4-8 weeks after the first dose. The third dose should be obtained 24 weeks after the first dose and 16 weeks after the second dose.  ? Zoster vaccine. One dose is recommended for adults aged 37 years or older unless certain conditions are present.   ? PREVNAR - Pneumococcal 13-valent conjugate (PCV13) vaccine. When indicated, a person who is uncertain of his immunization history and has no record of immunization should receive the PCV13 vaccine. An adult aged 69 years or older who has certain medical conditions and has not been previously immunized should receive 1 dose of PCV13 vaccine. This PCV13 should be followed with a dose of pneumococcal polysaccharide (PPSV23) vaccine. The PPSV23 vaccine dose should be obtained at least 8 weeks after the dose of PCV13 vaccine. An adult aged 51 years or older who has certain medical conditions and previously received 1 or more doses of PPSV23 vaccine should receive 1 dose of PCV13. The PCV13 vaccine dose should be obtained 1 or more years after the last PPSV23 vaccine dose.   ? PNEUMOVAX - Pneumococcal polysaccharide (PPSV23) vaccine. When PCV13 is also indicated, PCV13 should be obtained first. All adults aged 55 years and older should be immunized. An adult younger than age 78 years who has certain medical conditions should be immunized. Any person who resides in a nursing home or long-term care facility should be immunized. An adult smoker should be immunized. People with an immunocompromised condition and certain other conditions should receive both PCV13 and PPSV23 vaccines. People with human immunodeficiency virus (HIV) infection should be immunized as soon as possible after diagnosis.  Immunization during chemotherapy or radiation therapy should be avoided. Routine use of PPSV23 vaccine is not recommended for American Indians, [place] Natives, or people younger than 65 years unless there are medical conditions that require PPSV23 vaccine. When indicated, people who have unknown immunization and have no record of immunization should receive PPSV23 vaccine. One-time revaccination 5 years after the first dose of PPSV23 is recommended for people aged 19-64 years who have chronic kidney failure, nephrotic syndrome, asplenia, or immunocompromised conditions. People who received 1-2 doses of PPSV23 before age 1 years should receive another dose of PPSV23 vaccine at age 77 years  or later if at least 5 years have passed since the previous dose. Doses of PPSV23 are not needed for people immunized with PPSV23 at or after age 32 years.   ? Hepatitis A vaccine. Adults who wish to be protected from this disease, have certain high-risk conditions, work with hepatitis A-infected animals, work in hepatitis A research labs, or travel to or work in countries with a high rate of hepatitis A should be immunized. Adults who were previously unvaccinated and who anticipate close contact with an international adoptee during the first 60 days after arrival in the [place] from a country with a high rate of hepatitis A should be immunized.  ? Hepatitis B vaccine. Adults should be immunized if they wish to be protected from this disease, have certain high-risk conditions, may be exposed to blood or other infectious body fluids, are household contacts or sex partners of hepatitis B positive people, are clients or workers in certain care facilities, or travel to or work in countries with a high rate of hepatitis B.     Preventive Service / Frequency  . Ages 55 to 79  Blood pressure check.  Lipid and cholesterol check  Lung cancer screening. / Every year if you are aged 28-80 years and have a  30-pack-year history of smoking and currently smoke or have quit within the past 15 years. Yearly screening is stopped once you have quit smoking for at least 15 years or develop a health problem that would prevent you from having lung cancer treatment.  Fecal occult blood test (FOBT) of stool. / Every year beginning at age 77 and continuing until age 18. You may not have to do this test if you get a colonoscopy every 10 years.  Flexible sigmoidoscopy** or colonoscopy.** / Every 5 years for a flexible sigmoidoscopy or every 10 years for a colonoscopy beginning at age 11 and continuing until age 17. Screening for abdominal aortic aneurysm (AAA) by ultrasound is recommended for people who have history of high blood pressure or who are current or former smokers.  ++++++++++++++++++++++++++++++++++++++++++++++++++++++++++++++  Recommend Adult Low Dose Aspirin or coated Aspirin 81 mg daily To reduce risk of Colon Cancer 20 % Skin Cancer 26 %  Melanoma 46% and Pancreatic cancer 60%  +++++++++++++++++++++++++++++++++++++++++++++++++++++++++++++  Vitamin D goal is between 50-100. Please make sure that you are taking your Vitamin D as directed.  It is very important as a natural anti-inflammatory - helping with muscle and joint aches; as well as helping hair, skin, and nails; as well as reducing stroke, heart attack and cancer risk. It helps your bones and helps with mood. It also decreases numerous cancer risks so please take it as directed.  - Low Vit D is associated with a 200-300% higher risk for CANCER and 200-300% higher risk for HEART ATTACK & STROKE.  It is also associated with higher death rate at younger ages, autoimmune diseases like Rheumatoid arthritis, Lupus, Multiple Sclerosis; also many other serious conditions, like depression, Alzheimer's Dementia, infertility, muscle aches, fatigue, fibromyalgia - just to name a  few.  +++++++++++++++++++++++++++++++++++++++++++++++++++++++++++  Recommend the book "The END of DIETING" by Dr Excell Seltzer & the book "The END of DIABETES " by Dr Excell Seltzer At [email or web address] - get book & Audio CD's   --->Being diabetic has a 300% increased risk for heart attack, stroke, cancer, and alzheimer- type vascular dementia. It is very important that you work harder with diet by avoiding all foods that are white. Avoid white rice (brown &  wild rice is OK), white potatoes (sweet potatoes in moderation is OK), White bread or wheat bread or anything made out of white flour like bagels, donuts, rolls, buns, biscuits, cakes, pastries, cookies, pizza crust, and pasta (made from white flour & egg whites) - vegetarian pasta or spinach or wheat pasta is OK. Multigrain breads like Arnold's or [employer], or multigrain sandwich thins or flatbreads. Diet, exercise and weight loss can reverse and cure diabetes in the early stages. Diet, exercise and weight loss is very important in the control and prevention of complications of diabetes which affects every system in your body, ie. Brain - dementia/stroke, eyes - glaucoma/blindness, heart - heart attack/heart failure, kidneys - dialysis, stomach - gastric paralysis, intestines - malabsorption, nerves - severe painful neuritis, circulation - gangrene & loss of a leg(s), and finally cancer and Alzheimers.  I recommend avoid fried & greasy foods, sweets/candy, white rice (brown or wild rice or Quinoa is OK), white potatoes (sweet potatoes are OK) - anything made from white flour - bagels, doughnuts, rolls, buns, biscuits,white and wheat breads, pizza crust and traditional pasta made of white flour & egg white(vegetarian pasta or spinach or wheat pasta is OK). Multi-grain bread is OK - like multi-grain flat bread or sandwich thins. Avoid alcohol in excess.  Exercise is also important. Eat all the vegetables you want - avoid fatty meats, especially red meat  and dairy - especially cheese. Cheese is the most concentrated form of trans-fats which is the worst thing to clog up our arteries. Veggie cheese is OK which can be found in the fresh produce section at [employer] or [employer] or [employer].  ++++++++++++++++++++++ DASH Eating Plan  DASH stands for "Dietary Approaches to Stop Hypertension."  The DASH eating plan is a healthy eating plan that has been shown to reduce high blood pressure (hypertension). Additional health benefits may include reducing the risk of type 2 diabetes mellitus, heart disease, and stroke. The DASH eating plan may also help with weight loss.  WHAT DO I NEED TO KNOW ABOUT THE DASH EATING PLAN? For the DASH eating plan, you will follow these general guidelines: . Choose foods with a percent daily value for sodium of less than 5% (as listed on the food label). . Use salt-free seasonings or herbs instead of table salt or sea salt. . Check with your health care provider or pharmacist before using salt substitutes. . Eat lower-sodium products, often labeled as "lower sodium" or "no salt added." . Eat fresh foods. . Eat more vegetables, fruits, and low-fat dairy products. . Choose whole grains. Look for the word "whole" as the first word in the ingredient list. . Choose fish . Limit sweets, desserts, sugars, and sugary drinks. . Choose heart-healthy fats. . Eat veggie cheese . Eat more home-cooked food and less restaurant, buffet, and fast food. . Limit fried foods. Lacinda Axon foods using methods other than frying. . Limit canned vegetables. If you do use them, rinse them well to decrease the sodium. . When eating at a restaurant, ask that your food be prepared with less salt, or no salt if possible.   WHAT FOODS CAN I EAT? Read Dr Fara Olden Fuhrman's books on The End of Dieting & The End of Diabetes  Grains Whole grain or whole wheat bread. Brown rice. Whole grain or whole wheat pasta. Quinoa, bulgur, and  whole grain cereals. Low-sodium cereals. Corn or whole wheat flour tortillas. Whole grain cornbread. Whole grain crackers. Low-sodium crackers.  Vegetables Fresh or  frozen vegetables (raw, steamed, roasted, or grilled). Low-sodium or reduced-sodium tomato and vegetable juices. Low-sodium or reduced-sodium tomato sauce and paste. Low-sodium or reduced-sodium canned vegetables.  Fruits All fresh, canned (in natural juice), or frozen fruits.  Protein Products All fish and seafood. Dried beans, peas, or lentils. Unsalted nuts and seeds. Unsalted canned beans.  Dairy Low-fat dairy products, such as skim or 1% milk, 2% or reduced-fat cheeses, low-fat ricotta or cottage cheese, or plain low-fat yogurt. Low-sodium or reduced-sodium cheeses.  Fats and Oils Tub margarines without trans fats. Light or reduced-fat mayonnaise and salad dressings (reduced sodium). Avocado. Safflower, olive, or canola oils. Natural peanut or almond butter.  Other Unsalted popcorn and pretzels. The items listed above may not be a complete list of recommended foods or beverages. Contact your dietitian for more options.  ++++++++++++++++++++++++++++++++++++++++++++++++++++++++++++++++  WHAT FOODS ARE NOT RECOMMENDED?  Grains/ White flour or wheat flour White bread. White pasta. White rice. Refined cornbread. Bagels and croissants. Crackers that contain trans fat. Vegetables Creamed or fried vegetables. Vegetables in a . Regular canned vegetables. Regular canned tomato sauce and paste. Regular tomato and vegetable juices. Fruits Dried fruits. Canned fruit in light or heavy syrup. Fruit juice. Meat and Other Protein Products Meat in general - RED meat & White meat. Fatty cuts of meat. Ribs, chicken wings, all processed meats as bacon, sausage, bologna, salami, fatback, hot dogs, bratwurst and packaged luncheon meats. Dairy Whole or 2% milk, cream, half-and-half, and cream cheese. Whole-fat or sweetened yogurt. Full-fat  cheeses or blue cheese. Non-dairy creamers and whipped toppings. Processed cheese, cheese spreads, or cheese curds.  Condiments Onion and garlic salt, seasoned salt, table salt, and sea salt. Canned and packaged gravies. Worcestershire sauce. Tartar sauce. Barbecue sauce. Teriyaki sauce. Soy sauce, including reduced sodium. Steak sauce. Fish sauce. Oyster sauce. Cocktail sauce. Horseradish. Ketchup and mustard. Meat flavorings and tenderizers. Bouillon cubes. Hot sauce. Tabasco sauce. Marinades. Taco seasonings. Relishes. Fats and Oils Butter, stick margarine, lard, shortening and bacon fat. Coconut, palm kernel, or palm oils. Regular salad dressings. Pickles and olives. Salted popcorn and pretzels. The items listed above may not be a complete list of foods and beverages to avoid.

## 2017-11-21 LAB — HEMOGLOBIN A1C
Est. average glucose Bld gHb Est-mCnc: 128 mg/dL
HEMOGLOBIN A1C: 6.1 % — AB (ref 4.8–5.6)

## 2017-11-21 LAB — VITAMIN D 25 HYDROXY (VIT D DEFICIENCY, FRACTURES): VIT D 25 HYDROXY: 55.3 ng/mL (ref 30.0–100.0)

## 2017-11-24 ENCOUNTER — Telehealth: Payer: Self-pay | Admitting: Family Medicine

## 2017-11-24 ENCOUNTER — Other Ambulatory Visit: Payer: Self-pay

## 2017-11-24 DIAGNOSIS — Z1211 Encounter for screening for malignant neoplasm of colon: Secondary | ICD-10-CM

## 2017-11-24 NOTE — Telephone Encounter (Signed)
Referral was place and patient will be contacted to set up appointment. MPulliam, CMA/RT(R)

## 2017-11-24 NOTE — Telephone Encounter (Signed)
Pt has recently relocated to [place] does not have a GI doctor-- patient states it is time for her colonoscopy & needs a referral to a GI provider in [place] preferably. --glh

## 2017-11-25 ENCOUNTER — Encounter: Payer: Self-pay | Admitting: Gastroenterology

## 2017-11-25 ENCOUNTER — Other Ambulatory Visit: Payer: Self-pay

## 2017-11-25 ENCOUNTER — Telehealth: Payer: Self-pay | Admitting: Family Medicine

## 2017-11-25 DIAGNOSIS — Z1239 Encounter for other screening for malignant neoplasm of breast: Secondary | ICD-10-CM

## 2017-11-25 NOTE — Telephone Encounter (Signed)
Order has been placed, thanks.

## 2017-11-25 NOTE — Progress Notes (Signed)
Order for Mammogram. MPulliam, CMA/RT(R)

## 2017-11-25 NOTE — Telephone Encounter (Signed)
Patient needs an order for her mammo, can I please get one and let me know when it is to sch

## 2017-12-17 ENCOUNTER — Ambulatory Visit
Admission: RE | Admit: 2017-12-17 | Discharge: 2017-12-17 | Disposition: A | Discharge status: Home / Self Care | Payer: Medicare PPO | Source: Ambulatory Visit | Attending: Family Medicine | Admitting: Family Medicine

## 2017-12-17 ENCOUNTER — Other Ambulatory Visit: Payer: Self-pay | Admitting: Family Medicine

## 2017-12-17 DIAGNOSIS — Z1239 Encounter for other screening for malignant neoplasm of breast: Secondary | ICD-10-CM

## 2018-01-07 ENCOUNTER — Encounter: Payer: Self-pay | Admitting: Gastroenterology

## 2018-01-07 ENCOUNTER — Ambulatory Visit: Payer: Medicare PPO | Admitting: Gastroenterology

## 2018-01-07 ENCOUNTER — Other Ambulatory Visit (INDEPENDENT_AMBULATORY_CARE_PROVIDER_SITE_OTHER): Payer: Medicare PPO

## 2018-01-07 VITALS — BP 138/78 | HR 72 | Ht 65.0 in | Wt 163.1 lb

## 2018-01-07 DIAGNOSIS — K5900 Constipation, unspecified: Secondary | ICD-10-CM

## 2018-01-07 DIAGNOSIS — R198 Other specified symptoms and signs involving the digestive system and abdomen: Secondary | ICD-10-CM | POA: Diagnosis not present

## 2018-01-07 DIAGNOSIS — K625 Hemorrhage of anus and rectum: Secondary | ICD-10-CM

## 2018-01-07 DIAGNOSIS — K5180 Other ulcerative colitis without complications: Secondary | ICD-10-CM | POA: Diagnosis not present

## 2018-01-07 DIAGNOSIS — D729 Disorder of white blood cells, unspecified: Secondary | ICD-10-CM

## 2018-01-07 DIAGNOSIS — K51211 Ulcerative (chronic) proctitis with rectal bleeding: Secondary | ICD-10-CM | POA: Diagnosis not present

## 2018-01-07 LAB — CBC WITH DIFFERENTIAL/PLATELET
Basophils Absolute: 0.1 10*3/uL (ref 0.0–0.1)
Basophils Relative: 0.3 % (ref 0.0–3.0)
Eosinophils Absolute: 0.5 10*3/uL (ref 0.0–0.7)
Eosinophils Relative: 1.5 % (ref 0.0–5.0)
HCT: 39.8 % (ref 36.0–46.0)
Hemoglobin: 13.3 g/dL (ref 12.0–15.0)
LYMPHS ABS: 23.4 10*3/uL — AB (ref 0.7–4.0)
Lymphocytes Relative: 72.1 % — ABNORMAL HIGH (ref 12.0–46.0)
MCHC: 33.4 g/dL (ref 30.0–36.0)
MCV: 88.4 fl (ref 78.0–100.0)
MONO ABS: 0.8 10*3/uL (ref 0.1–1.0)
MONOS PCT: 2.6 % — AB (ref 3.0–12.0)
NEUTROS PCT: 23.5 % — AB (ref 43.0–77.0)
Neutro Abs: 7.6 10*3/uL (ref 1.4–7.7)
Platelets: 256 10*3/uL (ref 150.0–400.0)
RBC: 4.5 Mil/uL (ref 3.87–5.11)
RDW: 13.1 % (ref 11.5–15.5)

## 2018-01-07 LAB — COMPREHENSIVE METABOLIC PANEL
ALT: 13 U/L (ref 0–35)
AST: 14 U/L (ref 0–37)
Albumin: 4 g/dL (ref 3.5–5.2)
Alkaline Phosphatase: 73 U/L (ref 39–117)
BUN: 8 mg/dL (ref 6–23)
CALCIUM: 9.7 mg/dL (ref 8.4–10.5)
CO2: 28 mEq/L (ref 19–32)
Chloride: 95 mEq/L — ABNORMAL LOW (ref 96–112)
Creatinine, Ser: 0.62 mg/dL (ref 0.40–1.20)
GFR: 99.75 mL/min (ref >60.00)
Glucose, Bld: 96 mg/dL (ref 70–99)
POTASSIUM: 4 meq/L (ref 3.5–5.1)
SODIUM: 130 meq/L — AB (ref 135–145)
TOTAL PROTEIN: 6.9 g/dL (ref 6.0–8.3)
Total Bilirubin: 0.3 mg/dL (ref 0.2–1.2)

## 2018-01-07 LAB — C-REACTIVE PROTEIN: CRP: 0.5 mg/dL (ref 0.5–20.0)

## 2018-01-07 LAB — VITAMIN B12: VITAMIN B 12: 283 pg/mL (ref 211–911)

## 2018-01-07 LAB — FOLATE: Folate: 9.7 ng/mL (ref >5.9)

## 2018-01-07 LAB — FERRITIN: Ferritin: 33.8 ng/mL (ref 10.0–291.0)

## 2018-01-07 LAB — SEDIMENTATION RATE: Sed Rate: 15 mm/hr (ref 0–30)

## 2018-01-07 MED ORDER — MESALAMINE ER 0.375 G PO CP24: ORAL_CAPSULE | ORAL | 0 refills | Status: DC

## 2018-01-07 MED ORDER — MESALAMINE 1000 MG RE SUPP: 1000.0000 mg | Freq: Every day | RECTAL | 0 refills | Status: DC

## 2018-01-07 NOTE — Patient Instructions (Addendum)
Go to the basement for labs today   We will send Apriso and canasa  to your pharmacy  Take Benefiber 1 tablespoon three times a day with meals    If you are age 75 or older, your body mass index should be between 23-30. Your Body mass index is 27.15 kg/m. If this is out of the aforementioned range listed, please consider follow up with your Primary Care Provider.  If you are age 78 or younger, your body mass index should be between 19-25. Your Body mass index is 27.15 kg/m. If this is out of the aformentioned range listed, please consider follow up with your Primary Care Provider.

## 2018-01-07 NOTE — Progress Notes (Signed)
Mackenzie Barber    [id number]    12/15/1940  Primary Care Physician:Opalski, Neoma Laming, DO  Referring Physician: Mellody Dance, [place], [place] [address]  Chief complaint:  Ulcerative colitis, rectal bleeding diarrhea  HPI: 17 yr F with Chronic lymphocytic leukemia diagnosed in 2013, HTN, DM, ulcerative colitis and history of colon polyps is here to establish care.  She moved here from [place].  She had multiple colonoscopies in the past and was diagnosed with ulcerative colitis around 2006, she thinks only left side is affected.  She is on chronic balsalazide.  She is out of her medications for the past 1 month and reports having increased blood per rectum otherwise she has chronic tenderness medicine diarrhea and also small amount of blood per rectum at baseline.  Patient states she notices increased amount of rectal bleeding when she has to pass firm stool, was told her rectum is small and scarred with inflammation.  She also has constant urge to defecate and passes small squirts of mucus or liquid stools sometimes up to 15-20 times a day, usually happens on days where she is unable to have complete evacuation of stool and does not pass formed stool.  About 3-4 days a week alternating with 2-3 formed bowel movements on 2-3 days a week.  She avoids high-fiber diet as tendes to make her symptoms worse.  Denies any abdominal pain, nausea, vomiting, heartburn or dysphagia.  She has lower abdominal discomfort when she has frequent urge to defecate.  Her weight is stable.  Last colonoscopy in 2016.  Records are not available for review during this visit    Outpatient Encounter Medications as of 01/07/2018  Medication Sig  . Alcohol Swabs (B-D SINGLE USE SWABS REGULAR) PADS 1 Package by Does not apply route 2 (two) times daily.  Marland Kitchen atorvastatin (LIPITOR) 10 MG tablet Take 1 tablet (10 mg total) by mouth daily.  . balsalazide (COLAZAL) 750 MG capsule Take 2,250  mg by mouth 3 (three) times daily.  . Blood Glucose Calibration (ACCU-CHEK AVIVA) SOLN 1 drop by In Vitro route 2 (two) times daily.  . Blood Glucose Monitoring Suppl (ACCU-CHEK AVIVA) device Use as instructed  . Calcium Carbonate-Vitamin D 600-400 MG-UNIT tablet Take 1 tablet by mouth 2 (two) times daily. ( take 1200 calcium per day)  . Cholecalciferol (VITAMIN D3) 5000 units TABS 5,000 IU OTC vitamin D3 daily.  Marland Kitchen glucose blood (ACCU-CHEK AVIVA) test strip Use as instructed  . hydrochlorothiazide (HYDRODIURIL) 25 MG tablet Take 1 tablet (25 mg total) by mouth daily.  Elmore Guise Device MISC 1 Device by Does not apply route 2 (two) times daily.  Marland Kitchen losartan (COZAAR) 100 MG tablet Take 1 tablet (100 mg total) by mouth daily.  . metFORMIN (GLUCOPHAGE) 500 MG tablet Take 0.5 tablets (250 mg total) by mouth 2 (two) times daily with a meal. Need OV prior to further RF  . Vitamin D, Ergocalciferol, (DRISDOL) 50000 units CAPS capsule Take 1 capsule (50,000 Units total) by mouth every 7 (seven) days.   No facility-administered encounter medications on file as of 01/07/2018.     Allergies as of 01/07/2018  . (No Known Allergies)    Past Medical History:  Diagnosis Date  . Diabetes mellitus without complication ([place])   . Hyperlipidemia   . Hypertension     Past Surgical History:  Procedure Laterality Date  . ABDOMINAL HYSTERECTOMY    . TONSILECTOMY/ADENOIDECTOMY WITH MYRINGOTOMY  Family History  Problem Relation Age of Onset  . Cancer Mother        colon  . Hyperlipidemia Mother   . Hypertension Mother     Social History   Socioeconomic History  . Marital status: Unknown    Spouse name: Not on file  . Number of children: Not on file  . Years of education: Not on file  . Highest education level: Not on file  Social Needs  . Financial resource strain: Not on file  . Food insecurity - worry: Not on file  . Food insecurity - inability: Not on file  . Transportation needs -  medical: Not on file  . Transportation needs - non-medical: Not on file  Occupational History  . Not on file  Tobacco Use  . Smoking status: Never Smoker  . Smokeless tobacco: Never Used  Substance and Sexual Activity  . Alcohol use: No  . Drug use: No  . Sexual activity: No  Other Topics Concern  . Not on file  Social History Narrative  . Not on file      Review of systems: Review of Systems  Constitutional: Negative for fever and chills.  HENT: Negative.   Eyes: Negative for blurred vision.  Respiratory: Negative for cough, shortness of breath and wheezing.   Cardiovascular: Negative for chest pain and palpitations.  Gastrointestinal: as per HPI Genitourinary: Negative for dysuria, urgency, frequency and hematuria.  Musculoskeletal: Negative for myalgias, back pain and joint pain.  Skin: Negative for itching and rash.  Neurological: Negative for dizziness, tremors, focal weakness, seizures and loss of consciousness. Positive for headaches Endo/Heme/Allergies: Positive for seasonal allergies.  Psychiatric/Behavioral: Negative for depression, suicidal ideas and hallucinations.  All other systems reviewed and are negative.   Physical Exam: Vitals:   01/07/18 1350  BP: 138/78  Pulse: 72   Body mass index is 27.15 kg/m. Gen:      No acute distress HEENT:  EOMI, sclera anicteric Neck:     No masses; no thyromegaly Lungs:    Clear to auscultation bilaterally; normal respiratory effort CV:         Regular rate and rhythm; no murmurs Abd:      + bowel sounds; soft, non-tender; no palpable masses, no distension Ext:    No edema; adequate peripheral perfusion Skin:      Warm and dry; no rash Neuro: alert and oriented x 3 Psych: normal mood and affect Rectal exam was deferred during this visit due to patient request  Data Reviewed:  Reviewed labs, radiology imaging, old records and pertinent past GI work up   Assessment and Plan/Recommendations:  77 year old  female with chronic lymphocytic leukemia, left-sided ulcerative colitis diagnosed around 2006 here with complaints of rectal bleeding and tenesmus.  She has alternating constipation and diarrhea. We will start oral mesalamine, Apriso 1.5 g daily Canasa suppository at bedtime daily for 2-4 weeks Benefiber 1 tablespoon 3 times daily with meals and increased fluid intake to 10-12 cups daily Check CBC, CMP, CRP, B12, folate and ferritin level We will obtain records of prior colonoscopies and biopsies, prior GI records Return in 4 weeks or sooner if needed If symptoms continue to persist will consider flex sig versus colonoscopy for further evaluation    K. Denzil Magnuson , [place] [phone] Mon-Fri 8a-5p [phone] after 5p, weekends, holidays  CC: Mellody Dance, DO

## 2018-01-08 ENCOUNTER — Telehealth: Payer: Self-pay | Admitting: *Deleted

## 2018-01-08 ENCOUNTER — Encounter: Payer: Self-pay | Admitting: *Deleted

## 2018-01-08 LAB — PATHOLOGIST SMEAR REVIEW

## 2018-01-08 NOTE — Telephone Encounter (Signed)
Submitted prior Authorization for Canasa Suppositories today Waiting on response

## 2018-01-15 NOTE — Telephone Encounter (Signed)
canasa suppositories denied with insurance gave pt two boxes of sampels

## 2018-01-19 ENCOUNTER — Telehealth: Payer: Self-pay | Admitting: Gastroenterology

## 2018-01-19 NOTE — Telephone Encounter (Signed)
Iv done a prior auth on Canasa and it was denied  Dr Silverio Decamp please advise on alternative med  Thanks

## 2018-01-19 NOTE — Telephone Encounter (Signed)
Continue Oral Meslamine (Apriso). Will hold off Canasa now. Please check if we can get her samples for 2 weeks? Thanks

## 2018-01-19 NOTE — Telephone Encounter (Signed)
Will try and get Canasa samples for two weeks

## 2018-01-20 NOTE — Telephone Encounter (Signed)
See other phone note

## 2018-01-20 NOTE — Telephone Encounter (Signed)
Cant get samples of canasa anymore  Per Dr Silverio Decamp hold of on canasa for now... Patient is seeing Nicoletta Ba next week will discuss it further at her appointment

## 2018-01-28 ENCOUNTER — Encounter: Payer: Self-pay | Admitting: Physician Assistant

## 2018-01-28 ENCOUNTER — Ambulatory Visit: Payer: Medicare PPO | Admitting: Physician Assistant

## 2018-01-28 VITALS — BP 118/70 | HR 76 | Ht 65.0 in | Wt 162.4 lb

## 2018-01-28 DIAGNOSIS — K51911 Ulcerative colitis, unspecified with rectal bleeding: Secondary | ICD-10-CM | POA: Diagnosis not present

## 2018-01-28 MED ORDER — MESALAMINE ER 0.375 G PO CP24: ORAL_CAPSULE | ORAL | 6 refills | Status: DC

## 2018-01-28 MED ORDER — GLYCOPYRROLATE 2 MG PO TABS: ORAL_TABLET | ORAL | 3 refills | Status: DC

## 2018-01-28 NOTE — Progress Notes (Signed)
Subjective:    Patient ID: Mackenzie Barber, female    DOB: 1940/12/12, 77 y.o.   MRN: [id number]  HPI Mackenzie Barber is a pleasant 77 year old white female, recently established with Dr. Silverio Decamp and seen in consult on 01/07/2018.  She comes in today for follow-up Patient has history of left-sided ulcerative colitis and had previously been followed by a gastroenterologist in [facility].  She also was diagnosed with CLL in 2013, has history of hypertension GERD and adult onset diabetes mellitus as well as colon polyps. Patient states her last colonoscopy was done in 2016 and she believes she did have some polyps removed.  We have requested her records but have not received them to date. When she was seen in the office in February she had run out of her previous meds and was started on a Apriso 1.5 g total daily.  She was also to start Canasa suppositories and Benefiber. Her insurance would not approve Canasa.  Labs showed a normal CRP and sed rate, hemoglobin 13 hematocrit of 39 and WBC of 32.4. She says that she is feeling a bit better over the past couple of weeks, she has had less abdominal cramping and is seeing blood less frequently.  Does continue to be loose off and on and on a bad day she may have 3-4 bowel movements in the morning and 1 or 2 bowel movements in the afternoon.  She says after her evening meal she tends to have urgency for bowel movements and may pass very small "squirts" of stool.  She finds the evening time postprandial urgency most bothersome.  Review of Systems Pertinent positive and negative review of systems were noted in the above HPI section.  All other review of systems was otherwise negative.  Outpatient Encounter Medications as of 01/28/2018  Medication Sig  . Alcohol Swabs (B-D SINGLE USE SWABS REGULAR) PADS 1 Package by Does not apply route 2 (two) times daily.  Marland Kitchen atorvastatin (LIPITOR) 10 MG tablet Take 1 tablet (10 mg total) by mouth daily.  . Blood Glucose  Calibration (ACCU-CHEK AVIVA) SOLN 1 drop by In Vitro route 2 (two) times daily.  . Blood Glucose Monitoring Suppl (ACCU-CHEK AVIVA) device Use as instructed  . Calcium Carbonate-Vitamin D 600-400 MG-UNIT tablet Take 1 tablet by mouth 2 (two) times daily. ( take 1200 calcium per day)  . Cholecalciferol (VITAMIN D3) 5000 units TABS 5,000 IU OTC vitamin D3 daily.  Marland Kitchen glucose blood (ACCU-CHEK AVIVA) test strip Use as instructed  . hydrochlorothiazide (HYDRODIURIL) 25 MG tablet Take 1 tablet (25 mg total) by mouth daily.  Elmore Guise Device MISC 1 Device by Does not apply route 2 (two) times daily.  Marland Kitchen losartan (COZAAR) 100 MG tablet Take 1 tablet (100 mg total) by mouth daily.  . mesalamine (APRISO) 0.375 g 24 hr capsule Take four tablets daily  . metFORMIN (GLUCOPHAGE) 500 MG tablet Take 0.5 tablets (250 mg total) by mouth 2 (two) times daily with a meal. Need OV prior to further RF  . Vitamin D, Ergocalciferol, (DRISDOL) 50000 units CAPS capsule Take 1 capsule (50,000 Units total) by mouth every 7 (seven) days.  . [DISCONTINUED] balsalazide (COLAZAL) 750 MG capsule Take 2,250 mg by mouth 3 (three) times daily.  . [DISCONTINUED] mesalamine (CANASA) 1000 MG suppository Place 1 suppository (1,000 mg total) rectally at bedtime.  Marland Kitchen glycopyrrolate (ROBINUL-FORTE) 2 MG tablet Take 1 tab by mouth twice daily for cramping and spasms.  . mesalamine (APRISO) 0.375 g 24 hr  capsule Take 1 tab by mouth 4 tablets daily.   No facility-administered encounter medications on file as of 01/28/2018.    No Known Allergies Patient Active Problem List   Diagnosis Date Noted  . Counseling on health promotion and disease prevention 11/20/2017  . Hyperkeratotic Skin lesion of right leg- changing recently 11/20/2017  . Vitamin D insufficiency 06/24/2017  . Diabetes mellitus without complication ([place]) 09/47/0962  . Hypertension associated with diabetes ([place]) 06/11/2017  . Hyperlipidemia associated with type 2 diabetes  mellitus ([place]) 06/11/2017  . CLL (chronic lymphocytic leukemia) ([place]) 06/11/2017  . Ulcerative colitis ([place]) 06/11/2017  . Polyp of colon 06/11/2017  . GERD (gastroesophageal reflux disease) 06/11/2017  . S/P hysterectomy- about 7yr ago 06/11/2017  . Other fatigue 06/11/2017  . Osteoporosis screening 06/11/2017   Social History   Socioeconomic History  . Marital status: Unknown    Spouse name: Not on file  . Number of children: Not on file  . Years of education: Not on file  . Highest education level: Not on file  Social Needs  . Financial resource strain: Not on file  . Food insecurity - worry: Not on file  . Food insecurity - inability: Not on file  . Transportation needs - medical: Not on file  . Transportation needs - non-medical: Not on file  Occupational History  . Not on file  Tobacco Use  . Smoking status: Never Smoker  . Smokeless tobacco: Never Used  Substance and Sexual Activity  . Alcohol use: No  . Drug use: No  . Sexual activity: No  Other Topics Concern  . Not on file  Social History Narrative  . Not on file    Ms. Nylander's family history includes Cancer in her mother; Hyperlipidemia in her mother; Hypertension in her mother.      Objective:    Vitals:   01/28/18 1325  BP: 118/70  Pulse: 76    Physical Exam well-developed older white female in no acute distress, very pleasant blood pressure 118/70 pulse 76, BMI 27.0.  HEENT nontraumatic normocephalic EOMI PERRLA sclera anicteric, Cardiovascular regular rate and rhythm with S1-S2 no murmur rub or gallop, Pulmonary clear bilaterally, Abdomen soft, she is some minimal tenderness in the left mid and left lower quadrant there is no guarding or rebound no palpable mass or hepatosplenomegaly bowel sounds are present, Rectal exam not done, Extremities no clubbing cyanosis or edema skin warm and dry, Neuro psych mood and affect appropriate       Assessment & Plan:   #[id number] old white female with  history of left-sided ulcerative colitis initially diagnosed 2006, [facility] Patient had a mild flare of symptoms well off of meds [place] She is currently improving on Apriso 1.5 g daily but still having at least 4-6 loose bowel movements daily. #2 history of colon polyps-type not clear records pending #3 adult onset diabetes mellitus #4.  GERD #5.  Hypertension #6.  CLL  Plan; continue Apriso 4 tablets daily for total of 1.5 g daily And Robinul Forte 2 mg p.o. twice daily Patient signed a release again today and will re-request her previous records We will plan office follow-up in 3 months with Dr. NSilverio Decamp Patient was advised that should she have any worsening of symptoms in the interim, or fails to continue to improve to call for advice and may start trial of low-dose steroids or Ucerisat that time. Timing of follow-up colonoscopy will  be determined after review of her outside records.  Amy S Esterwood PA-C 01/28/2018   Cc: Mellody Dance, DO

## 2018-01-28 NOTE — Patient Instructions (Addendum)
We have sent the following medications to your pharmacy for you to pick up at your convenience: [facility]. 1. Robinul Forte 2 mg 2. Apriso 0.375 g  If you are age 75 or older, your body mass index should be between 23-30. Your Body mass index is 27.02 kg/m. If this is out of the aforementioned range listed, please consider follow up with your Primary Care Provider.  Call us for any problems: [phone], choose option 2.

## 2018-02-02 NOTE — Progress Notes (Signed)
Reviewed and agree with documentation and assessment and plan. K. Veena Nandigam , MD   

## 2018-02-12 ENCOUNTER — Ambulatory Visit: Payer: Medicare PPO | Admitting: Hematology & Oncology

## 2018-02-12 ENCOUNTER — Other Ambulatory Visit: Payer: Medicare PPO

## 2018-02-17 ENCOUNTER — Other Ambulatory Visit: Payer: Self-pay | Admitting: Family Medicine

## 2018-02-17 DIAGNOSIS — I10 Essential (primary) hypertension: Secondary | ICD-10-CM

## 2018-02-17 DIAGNOSIS — E7849 Other hyperlipidemia: Secondary | ICD-10-CM

## 2018-03-09 ENCOUNTER — Ambulatory Visit: Payer: Medicare PPO | Admitting: Hematology & Oncology

## 2018-03-09 ENCOUNTER — Other Ambulatory Visit: Payer: Medicare PPO

## 2018-03-17 ENCOUNTER — Ambulatory Visit: Payer: Medicare PPO | Admitting: Family Medicine

## 2018-04-10 ENCOUNTER — Inpatient Hospital Stay: Payer: Medicare PPO | Admitting: Hematology & Oncology

## 2018-04-10 ENCOUNTER — Inpatient Hospital Stay: Payer: Medicare PPO | Attending: Hematology & Oncology

## 2018-04-22 ENCOUNTER — Other Ambulatory Visit: Payer: Self-pay | Admitting: Family Medicine

## 2018-04-22 DIAGNOSIS — E7849 Other hyperlipidemia: Secondary | ICD-10-CM

## 2018-04-22 DIAGNOSIS — I10 Essential (primary) hypertension: Secondary | ICD-10-CM

## 2018-05-05 ENCOUNTER — Ambulatory Visit: Payer: Medicare PPO | Admitting: Family Medicine

## 2018-05-05 ENCOUNTER — Encounter: Payer: Self-pay | Admitting: Family Medicine

## 2018-05-05 VITALS — BP 131/79 | HR 70 | Ht 65.0 in | Wt 161.6 lb

## 2018-05-05 DIAGNOSIS — C9110 Chronic lymphocytic leukemia of B-cell type not having achieved remission: Secondary | ICD-10-CM

## 2018-05-05 DIAGNOSIS — E1159 Type 2 diabetes mellitus with other circulatory complications: Secondary | ICD-10-CM | POA: Diagnosis not present

## 2018-05-05 DIAGNOSIS — Z8679 Personal history of other diseases of the circulatory system: Secondary | ICD-10-CM | POA: Insufficient documentation

## 2018-05-05 DIAGNOSIS — I10 Essential (primary) hypertension: Secondary | ICD-10-CM | POA: Diagnosis not present

## 2018-05-05 DIAGNOSIS — E119 Type 2 diabetes mellitus without complications: Secondary | ICD-10-CM | POA: Diagnosis not present

## 2018-05-05 DIAGNOSIS — K51211 Ulcerative (chronic) proctitis with rectal bleeding: Secondary | ICD-10-CM

## 2018-05-05 DIAGNOSIS — E538 Deficiency of other specified B group vitamins: Secondary | ICD-10-CM

## 2018-05-05 DIAGNOSIS — C9190 Lymphoid leukemia, unspecified not having achieved remission: Secondary | ICD-10-CM

## 2018-05-05 DIAGNOSIS — R5383 Other fatigue: Secondary | ICD-10-CM | POA: Diagnosis not present

## 2018-05-05 DIAGNOSIS — I152 Hypertension secondary to endocrine disorders: Secondary | ICD-10-CM

## 2018-05-05 DIAGNOSIS — E559 Vitamin D deficiency, unspecified: Secondary | ICD-10-CM | POA: Diagnosis not present

## 2018-05-05 DIAGNOSIS — E785 Hyperlipidemia, unspecified: Secondary | ICD-10-CM | POA: Diagnosis not present

## 2018-05-05 DIAGNOSIS — E1169 Type 2 diabetes mellitus with other specified complication: Secondary | ICD-10-CM | POA: Diagnosis not present

## 2018-05-05 LAB — POCT GLYCOSYLATED HEMOGLOBIN (HGB A1C): HbA1c, POC (controlled diabetic range): 6.2 % (ref 0.0–7.0)

## 2018-05-05 NOTE — Progress Notes (Signed)
Impression and Recommendations:    1. Diabetes mellitus without complication ([place])   2. Hyperlipidemia associated with type 2 diabetes mellitus ([place])   3. Hypertension associated with diabetes ([place])   4. 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 CLL (chronic lymphocytic leukemia) ([place])   6. Vitamin D insufficiency   7. Other fatigue   8. History of irregular heartbeat-  infreq episodes and asymptomatic lasting less than 1-2 minutes   9. Low normal serum vitamin B12     1. Diabetes Mellitus - Symptoms well controlled at this time.  No changes made to treatment today.  - Counseled patient on pathophysiology of disease and discussed various treatment options, including ongoing dietary and lifestyle modifications as first line.  Importance of low carb/ketogenic diet discussed with patient in addition to regular exercise.   - Encouraged patient to check FBS and 2 hours after the biggest meal of her day.  Keep log and bring in next OV for my review.   Also, if you ever feel poorly, please check your blood pressure and blood sugar, as one or the other could be the cause of your symptoms.  - Being a diabetic, you need yearly eye and foot exams. Make appt.for diabetic eye exam   - Continue current medication(s).   - Metformin refilled today.  - Since patient's B12 is in the low normal range, which can be associated with metformin use, handout provided to patient on B12 deficiency.  Recommended that the patient begin supplementation.  2. Blood Pressure - Patient asymptomatic at this time, well-controlled. - No changes to treatment made today.  Continue current medications.  Episodic Irregular Heartbeat - Given patient's concerns about occasional "butterfly flutter" in her chest, advised patient to obtain a pulse monitor if she wishes, to more closely monitor her heart rate and rhythm.  - Reviewed red flag heart symptoms at length with the patient today.  Advised patient to  watch for increased frequency of "heart fluttering."  If she experiences "heart fluttering" with exertion or with walking, associated with pain or tightness in the chest, or if the flutter becomes more frequent, more concerning, or occurs with more associated symptoms (such as dizziness, lightheadedness, shortness of breath), or if the flutter occurs for a prolonged period of time, patient knows to obtain IMMEDIATE medical assistance.  3. Cholesterol - Continue taking Lipitor nightly before bed. - No changes to treatment made at this time.  - Advised patient on the importance of avoiding trans fats, avoiding saturated fats, and consuming fewer fatty carbs.  - Patient is fasting today. Lipid panel will be drawn today.  4. Recent Colitis Flare - Patient will continue to obtain follow-up care through [facility] patient to call [facility] and let them know that her new medication for colitis is not controlling her symptoms.  5. Dermatological Follow-Up - Patient is not established yet; prefers to be in [name] near [place].  - Patient declines referral at this time.  She agrees to find a Paediatric nurse at [facility] and schedule an appointment on her own.  6. Lifestyle & Preventative Health Maintenance - Advised patient to continue working toward exercising to improve health.  Encouraged patient to find a [employer] program that is accessible to her, and begin engaging in physical activity with a friendly community.  - Patient will continue with at least 30 minutes of activity three days a week.  Recommended that the patient eventually strive for at least 150  minutes of moderate cardiovascular activity per week according to guidelines established by the AHA.   - Healthy dietary habits encouraged, including low-carb, and high amounts of lean protein in diet.   - Patient should also consume adequate amounts of water - half of body weight in oz of water per day.  7. Follow Up -  Advised patient today that if she wishes, she may return around 06/16/2018 and have a Medicare Wellness visit along with blood drawn.  Patient declines at this time and wishes for blood work to be drawn today.  - Patient knows to continue to follow-up with [facility] for her colitis, and make sure to re-establish follow-up for CLL with Dr. Marin Olp.    Education and routine counseling performed. Handouts provided.  Orders Placed This Encounter  Procedures  . CBC with Differential/Platelet  . Comprehensive metabolic panel  . VITAMIN D 25 Hydroxy (Vit-D Deficiency, Fractures)  . TSH  . Lipid panel  . T4, free  . POCT glycosylated hemoglobin (Hb A1C)    No orders of the defined types were placed in this encounter.   Return in about 4 months (around 09/04/2018) for Follow-up diabetes, blood pressure etc..   The patient was counseled, risk factors were discussed, anticipatory guidance given.  Gross side effects, risk and benefits, and alternatives of medications discussed with patient.  Patient is aware that all medications have potential side effects and we are unable to predict every side effect or drug-drug interaction that may occur.  Expresses verbal understanding and consents to current therapy plan and treatment regimen.  Please see AVS handed out to patient at the end of our visit for further patient instructions/ counseling done pertaining to today's office visit.    Note: This document was prepared using Dragon voice recognition software and may include unintentional dictation errors.  This document serves as a record of services personally performed by Mellody Dance, DO. It was created on her behalf by Toni Amend, a trained medical scribe. The creation of this record is based on the scribe's personal observations and the provider's statements to them.   I have reviewed the above medical documentation for accuracy and completeness and I concur.  Mellody Dance 05/10/18 10:32 PM     Subjective:    Chief Complaint  Patient presents with  . Follow-up    Mackenzie Barber is a 77 y.o. female who presents to [place] at [facility] today for Diabetes Management.    Recent Colitis Flare Patient reports a recent colitis flare-up.  She had her medication changed, but notes that it's not controlling her symptoms.  Her colitis is treated and cared for by [facility].  She fears that she needs to go back in and "be prepped for a colon."  She states that she keeps hoping that the medication will kick in, but it hasn't yet.  CLL Follow-Up Patient follows up with Dr. Marin Olp every 6 months for her CLL.  She did miss her last appointment, because she got lost on the way there.  Patient is still new to the area since moving from [place].  Dermatology Screenings Patient does not have a dermatologist yet.  She plans to schedule with a dermatologist through [facility].  Lifestyle Patient admits that she's finally starting to get adjusted to life here.  States that she misses the mountains, but "maybe this place isn't so bad."  She is learning how to find her way around [place], but still gets lost from  time to time.    Patient is currently learning how to use a GPS and a smart phone.  DM HPI: -  She has been working on diet and exercise for diabetes  She's been exercising; still walking, "that's about all I do."  She also climbs steps due to living in an upstairs apartment.  She notes she tries to get in about 3 or 4 walks a week, and walks about 30 minutes each time.  Notes she moves about a mile each time.  Pt is currently maintained on the following medications for diabetes:   see med list today Medication compliance - Patient confirms that she continues taking 250 metformin in the morning, and 250 in the evening.  Going down on the metformin dose, she noticed no change in her GI or bowel at all.  Home glucose readings  range:  Patient does not measure her blood sugar at home.   Denies polyuria/polydipsia. Denies hypo/ hyperglycemia symptoms - She denies new onset of: chest pain, exercise intolerance, shortness of breath, dizziness, visual changes, headache, lower extremity swelling or claudication.   Last diabetic eye exam was No results found for: HMDIABEYEEXA  Foot exam- UTD  Last A1C in the office was:  Lab Results  Component Value Date   HGBA1C 6.2 05/05/2018   HGBA1C 6.1 (H) 11/20/2017   HGBA1C 6.1 (H) 06/16/2017    Lab Results  Component Value Date   LDLCALC 63 05/05/2018   CREATININE 0.69 05/05/2018    1. HTN HPI:  -  Her blood pressure has been controlled at home.  Pt is not checking it at home.   - Patient reports good compliance with blood pressure medications  - Denies medication S-E   - Smoking Status noted   - She denies new onset of: chest pain, exercise intolerance, shortness of breath, dizziness, visual changes, headache, lower extremity swelling or claudication.   Notes that sometimes she has a "butterfly" fluttering feeling on her left side.  States it's not ery frequent; it comes and goes, "it doesn't last very long."  She thinks it comes on when she's tired, not during exertion.  Notes that this "butterfly flutter" only lasts about two minutes.  Last 3 blood pressure readings in our office are as follows: BP Readings from Last 3 Encounters:  05/05/18 131/79  01/28/18 118/70  01/07/18 138/78    Filed Weights   05/05/18 0819  Weight: 161 lb 9.6 oz (73.3 kg)    2. 77 y.o. female here for cholesterol follow-up.   - Patient reports good compliance with medications or treatment plan.  She continues taking her Lipitor nightly.  - Denies medication S-E   - Smoking Status noted   - She denies new onset of: chest pain, exercise intolerance, shortness of breath, dizziness, visual changes, headache, lower extremity swelling or claudication.   Denies new  myalgias.  The cholesterol last visit was:  Lab Results  Component Value Date   CHOL 143 05/05/2018   HDL 45 05/05/2018   LDLCALC 63 05/05/2018   TRIG 175 (H) 05/05/2018   CHOLHDL 3.2 05/05/2018    Hepatic Function Latest Ref Rng & Units 05/05/2018 01/07/2018 08/14/2017  Total Protein 6.0 - 8.5 g/dL 6.7 6.9 7.0  Albumin 3.5 - 4.8 g/dL 4.3 4.0 3.9  AST 0 - 40 IU/L 20 14 23   ALT 0 - 32 IU/L 16 13 18   Alk Phosphatase 39 - 117 IU/L 84 73 73  Total Bilirubin 0.0 - 1.2 mg/dL 0.3  0.3 0.60     Last 3 blood pressure readings in our office are as follows: BP Readings from Last 3 Encounters:  05/05/18 131/79  01/28/18 118/70  01/07/18 138/78    BMI Readings from Last 3 Encounters:  05/05/18 26.89 kg/m  01/28/18 27.02 kg/m  01/07/18 27.15 kg/m     No problems updated.    Patient Care Team    Relationship Specialty Notifications Start End  Mellody Dance, DO PCP - General Family Medicine  06/11/17      Patient Active Problem List   Diagnosis Date Noted  . Diabetes mellitus without complication ([place]) [id number]/[id number]    Priority: High  . Hypertension associated with diabetes ([place]) 06/11/2017    Priority: Medium  . Hyperlipidemia associated with type 2 diabetes mellitus ([place]) 06/11/2017    Priority: Medium  . Ulcerative colitis ([place]) 06/11/2017    Priority: Medium  . CLL (chronic lymphocytic leukemia) ([place]) 06/11/2017    Priority: Low  . Polyp of colon 06/11/2017    Priority: Low  . History of irregular heartbeat-  infreq episodes and asymptomatic lasting less than 1-2 minutes 05/05/2018  . Counseling on health promotion and disease prevention 11/20/2017  . Hyperkeratotic Skin lesion of right leg- changing recently 11/20/2017  . Vitamin D insufficiency 06/24/2017  . GERD (gastroesophageal reflux disease) 06/11/2017  . S/P hysterectomy- about 21yr ago 06/11/2017  . Other fatigue 06/11/2017  . Osteoporosis screening 06/11/2017     Past Medical History:  Diagnosis  Date  . Diabetes mellitus without complication ([place]   . Hyperlipidemia   . Hypertension   . Ulcerative proctitis ([facility]      Past Surgical History:  Procedure Laterality Date  . ABDOMINAL HYSTERECTOMY    . TONSILECTOMY/ADENOIDECTOMY WITH MYRINGOTOMY       Family History  Problem Relation Age of Onset  . Cancer Mother        colon  . Hyperlipidemia Mother   . Hypertension Mother      Social History   Substance and Sexual Activity  Drug Use No  ,  Social History   Substance and Sexual Activity  Alcohol Use No  ,  Social History   Tobacco Use  Smoking Status Never Smoker  Smokeless Tobacco Never Used  ,    Current Outpatient Medications on File Prior to Visit  Medication Sig Dispense Refill  . Alcohol Swabs (B-D SINGLE USE SWABS REGULAR) PADS 1 Package by Does not apply route 2 (two) times daily. 100 each 0  . atorvastatin (LIPITOR) 10 MG tablet TAKE 1 TABLET EVERY DAY 90 tablet 0  . Blood Glucose Calibration (ACCU-CHEK AVIVA) SOLN 1 drop by In Vitro route 2 (two) times daily. 1 each 11  . Blood Glucose Monitoring Suppl (ACCU-CHEK AVIVA) device Use as instructed 1 each 0  . Calcium Carbonate-Vitamin D 600-400 MG-UNIT tablet Take 1 tablet by mouth 2 (two) times daily. ( take 1200 calcium per day) 60 tablet 11  . Cholecalciferol (VITAMIN D3) 5000 units TABS 5,000 IU OTC vitamin D3 daily. 90 tablet 3  . glucose blood (ACCU-CHEK AVIVA) test strip Use as instructed 100 each 12  . glycopyrrolate (ROBINUL-FORTE) 2 MG tablet Take 1 tab by mouth twice daily for cramping and spasms. 60 tablet 3  . hydrochlorothiazide (HYDRODIURIL) 25 MG tablet TAKE 1 TABLET EVERY DAY 90 tablet 0  . Lancet Device MISC 1 Device by Does not apply route 2 (two) times daily. 1 each 11  . losartan (COZAAR) 100 MG tablet TAKE 1  TABLET EVERY DAY 90 tablet 0  . mesalamine (APRISO) 0.375 g 24 hr capsule Take 1 tab by mouth 4 tablets daily. 120 capsule 6  . metFORMIN (GLUCOPHAGE) 500 MG tablet Take  0.5 tablets (250 mg total) by mouth 2 (two) times daily with a meal. Need OV prior to further RF 90 tablet 0  . Vitamin D, Ergocalciferol, (DRISDOL) 50000 units CAPS capsule Take 1 capsule (50,000 Units total) by mouth every 7 (seven) days. 12 capsule 10   No current facility-administered medications on file prior to visit.      No Known Allergies   Review of Systems:   General:  Denies fever, chills Optho/Auditory:   Denies visual changes, blurred vision Respiratory:   Denies SOB, cough, wheeze, DIB  Cardiovascular:   Denies chest pain, palpitations, painful respirations Gastrointestinal:   Denies nausea, vomiting, diarrhea.  Endocrine:     Denies new hot or cold intolerance Musculoskeletal:  Denies joint swelling, gait issues, or new unexplained myalgias/ arthralgias Skin:  Denies rash, suspicious lesions  Neurological:    Denies dizziness, unexplained weakness, numbness  Psychiatric/Behavioral:   Denies mood changes    Objective:     Blood pressure 131/79, pulse 70, height 5' 5"  (1.651 m), weight 161 lb 9.6 oz (73.3 kg), SpO2 98 %.  Body mass index is 26.89 kg/m.  General: Well Developed, well nourished, and in no acute distress.  HEENT: Normocephalic, atraumatic, pupils equal round reactive to light, neck supple, No carotid bruits, no JVD Skin: Warm and dry, cap RF less 2 sec Cardiac: Regular rate and rhythm, S1, S2 WNL's, no murmurs rubs or gallops Respiratory: ECTA B/L, Not using accessory muscles, speaking in full sentences. NeuroM-Sk: Ambulates w/o assistance, moves ext * 4 w/o difficulty, sensation grossly intact.  Ext: scant edema b/l lower ext Psych: No HI/SI, judgement and insight good, Euthymic mood. Full Affect.

## 2018-05-05 NOTE — Patient Instructions (Addendum)
Please look up [facility].   Please make sure you find a dermatologist and let us know who that is, and asked them to send their medical records to [place].  If you need help with this please let us know.     Vitamin B12 Deficiency Vitamin B12 deficiency occurs when the body does not have enough vitamin B12. Vitamin B12 is an important vitamin. The body needs vitamin B12:  To make red blood cells.  To make DNA. This is the genetic material inside cells.  To help the nerves work properly so they can carry messages from the brain to the body.  Vitamin B12 deficiency can cause various health problems, such as a low red blood cell count (anemia) or nerve damage. What are the causes? This condition may be caused by:  Not eating enough foods that contain vitamin B12.  Not having enough stomach acid and digestive fluids to properly absorb vitamin B12 from the food that you eat.  Certain digestive system diseases that make it hard to absorb vitamin B12. These diseases include Crohn disease, chronic pancreatitis, and cystic fibrosis.  Pernicious anemia. This is a condition in which the body does not make enough of a protein (intrinsic factor), resulting in too few red blood cells.  Having a surgery in which part of the stomach or small intestine is removed.  Taking certain medicines that make it hard for the body to absorb vitamin B12. These medicines include: ? Heartburn medicine (antacids and proton pump inhibitors). ? An antibiotic medicine called neomycin. ? Some medicines that are used to treat diabetes, tuberculosis, gout, or high cholesterol.  What increases the risk? The following factors may make you more likely to develop a B12 deficiency:  Being older than age 53.  Eating a vegetarian or vegan diet, especially while you are pregnant.  Eating a poor diet while you are pregnant.  Taking certain drugs.  Having alcoholism.  What are the signs or symptoms? In some cases,  there are no symptoms of this condition. If the condition leads to anemia or nerve damage, various symptoms can occur, such as:  Weakness.  Fatigue.  Loss of appetite.  Weight loss.  Numbness or tingling in your hands and feet.  Redness and burning of the tongue.  Confusion or memory problems.  Depression.  Sensory problems, such as color blindness, ringing in the ears, or loss of taste.  Diarrhea or constipation.  Trouble walking.  If anemia is severe, symptoms can include:  Shortness of breath.  Dizziness.  Rapid heart rate (tachycardia).  How is this diagnosed? This condition may be diagnosed with a blood test to measure the level of vitamin B12 in your blood. You may have other tests to help find the cause of your vitamin B12 deficiency. These tests may include:  A complete blood count (CBC). This is a group of tests that measure certain characteristics of blood cells.  A blood test to measure intrinsic factor.  An endoscopy. In this procedure, a thin tube with a camera on the end is used to look into your stomach or intestines.  How is this treated? Treatment for this condition depends on the cause. Common treatment options include:  Changing your eating and drinking habits, such as: ? Eating more foods that contain vitamin B12. ? Drinking less alcohol or no alcohol.  Taking vitamin B12 supplements. Your health care provider will tell you which dosage is best for you.  Getting vitamin B12 injections.  Follow these  instructions at home:  Take supplements only as told by your health care provider. Follow the directions carefully.  Get any injections that are prescribed by your health care provider.  Do not miss your appointments.  Eat lots of healthy foods that contain vitamin B12. Ask your health care provider if you should work with a dietitian. Foods that contain vitamin B12 include: ? Meat. ? Meat from birds (poultry). ? Fish. ? Eggs. ? Cereal and  dairy products that are fortified. This means that vitamin B12 has been added to the food. Check the label on the package to see if the food is fortified.  Do not abuse alcohol.  Keep all follow-up visits as told by your health care provider. This is important. Contact a health care provider if:  Your symptoms come back. Get help right away if:  You develop shortness of breath.  You have chest pain.  You become dizzy or you lose consciousness. This information is not intended to replace advice given to you by your health care provider. Make sure you discuss any questions you have with your health care provider. Document Released: 01/20/2012 Document Revised: 04/10/2016 Document Reviewed: 03/15/2015 Elsevier Interactive Patient Education  2018 [employer].   Vitamin B12 oral What is this medicine? CYANOCOBALAMIN (sye an oh koe BAL a min) is a man made form of vitamin B12. Vitamin B12 is essential in the development of healthy blood cells, nerve cells, and proteins in the body. It also helps with the metabolism of fats and carbohydrates. It is added to a healthy diet to prevent or treat low vitamin B-12 levels. This medicine may be used for other purposes; ask your health care provider or pharmacist if you have questions. What should I tell my health care provider before I take this medicine? They need to know if you have any of these conditions: -anemia -kidney disease -Leber's disease -malabsorption disorder -an unusual or allergic reaction to cyanocobalamin, cobalt, other medicines, foods, dyes, or preservatives -pregnant or trying to get pregnant -breast-feeding How should I use this medicine? Take this medicine by mouth with a glass of water. Follow the directions on the package or prescription label. If you are taking the tablets, do not chew, cut, or crush this medicine. If using an vitamin solution, use a specially marked spoon or dropper to measure each dose. Ask your  pharmacist if you do not have one. Household spoons are not accurate. For best results take this vitamin with food. Take your medicine at regular intervals. Do not take your medicine more often than directed. Talk to your pediatrician regarding the use of this medicine in children. While this drug may be prescribed for selected conditions, precautions do apply. Overdosage: If you think you have taken too much of this medicine contact a poison control center or emergency room at once. NOTE: This medicine is only for you. Do not share this medicine with others. What if I miss a dose? If you miss a dose, take it as soon as you can. If it is almost time for your next dose, take only that dose. Do not take double or extra doses. What may interact with this medicine? -alcohol -aminosalicylic acid -colchicine -medicines that suppress your bone marrow like chemotherapy, chloramphenicol This list may not describe all possible interactions. Give your health care provider a list of all the medicines, herbs, non-prescription drugs, or dietary supplements you use. Also tell them if you smoke, drink alcohol, or use illegal drugs. Some items may  interact with your medicine. What should I watch for while using this medicine? Follow a healthy diet. Taking a vitamin supplement does not replace the need for a balanced diet. Some foods that have vitamin B-12 naturally are fish, seafood, egg yolk, milk and fermented cheese. Too much of this vitamin can be unsafe. Talk to your doctor or health care provider about how much is right for you. What side effects may I notice from receiving this medicine? Side effects that you should report to your doctor or health care professional as soon as possible: -allergic reactions like skin rash, itching or hives, swelling of the face, lips, or tongue -breathing problems -chest pain, tightness Side effects that usually do not require medical attention (report to your doctor or  health care professional if they continue or are bothersome): -diarrhea This list may not describe all possible side effects. Call your doctor for medical advice about side effects. You may report side effects to FDA at 1-800-FDA-1088. Where should I keep my medicine? Keep out of the reach of children. Store at room temperature between 15 and 30 degrees C (59 and 85 degrees F). Protect from heat and light. Throw away any unused medicine after the expiration date. NOTE: This sheet is a summary. It may not cover all possible information. If you have questions about this medicine, talk to your doctor, pharmacist, or health care provider.  2018 Elsevier/Gold Standard (2012-03-31 07:58:17)

## 2018-05-06 LAB — CBC WITH DIFFERENTIAL/PLATELET
BASOS ABS: 0.1 10*3/uL (ref 0.0–0.2)
BASOS: 0 %
EOS (ABSOLUTE): 0.2 10*3/uL (ref 0.0–0.4)
EOS: 1 %
HEMATOCRIT: 38.9 % (ref 34.0–46.6)
HEMOGLOBIN: 13.3 g/dL (ref 11.1–15.9)
IMMATURE GRANS (ABS): 0 10*3/uL (ref 0.0–0.1)
Immature Granulocytes: 0 %
LYMPHS ABS: 27 10*3/uL — AB (ref 0.7–3.1)
LYMPHS: 80 %
MCH: 29.1 pg (ref 26.6–33.0)
MCHC: 34.2 g/dL (ref 31.5–35.7)
MCV: 85 fL (ref 79–97)
MONOCYTES: 2 %
Monocytes Absolute: 0.9 10*3/uL (ref 0.1–0.9)
NEUTROS ABS: 6 10*3/uL (ref 1.4–7.0)
Neutrophils: 17 %
PLATELETS: 258 10*3/uL (ref 150–450)
RBC: 4.57 x10E6/uL (ref 3.77–5.28)
RDW: 13.6 % (ref 12.3–15.4)
WBC: 34.2 10*3/uL (ref 3.4–10.8)

## 2018-05-06 LAB — COMPREHENSIVE METABOLIC PANEL
A/G RATIO: 1.8 (ref 1.2–2.2)
ALBUMIN: 4.3 g/dL (ref 3.5–4.8)
ALT: 16 IU/L (ref 0–32)
AST: 20 IU/L (ref 0–40)
Alkaline Phosphatase: 84 IU/L (ref 39–117)
BUN / CREAT RATIO: 13 (ref 12–28)
BUN: 9 mg/dL (ref 8–27)
Bilirubin Total: 0.3 mg/dL (ref 0.0–1.2)
CALCIUM: 9.8 mg/dL (ref 8.7–10.3)
CO2: 24 mmol/L (ref 20–29)
CREATININE: 0.69 mg/dL (ref 0.57–1.00)
Chloride: 98 mmol/L (ref 96–106)
GFR, EST AFRICAN AMERICAN: 98 mL/min/{1.73_m2} (ref >59)
GFR, EST NON AFRICAN AMERICAN: 85 mL/min/{1.73_m2} (ref >59)
GLOBULIN, TOTAL: 2.4 g/dL (ref 1.5–4.5)
Glucose: 106 mg/dL — ABNORMAL HIGH (ref 65–99)
POTASSIUM: 4.4 mmol/L (ref 3.5–5.2)
SODIUM: 135 mmol/L (ref 134–144)
TOTAL PROTEIN: 6.7 g/dL (ref 6.0–8.5)

## 2018-05-06 LAB — LIPID PANEL
CHOL/HDL RATIO: 3.2 ratio (ref 0.0–4.4)
Cholesterol, Total: 143 mg/dL (ref 100–199)
HDL: 45 mg/dL (ref >39)
LDL CALC: 63 mg/dL (ref 0–99)
Triglycerides: 175 mg/dL — ABNORMAL HIGH (ref 0–149)
VLDL CHOLESTEROL CAL: 35 mg/dL (ref 5–40)

## 2018-05-06 LAB — TSH: TSH: 3.12 u[IU]/mL (ref 0.450–4.500)

## 2018-05-06 LAB — T4, FREE: FREE T4: 1.35 ng/dL (ref 0.82–1.77)

## 2018-05-06 LAB — VITAMIN D 25 HYDROXY (VIT D DEFICIENCY, FRACTURES): Vit D, 25-Hydroxy: 52.1 ng/mL (ref 30.0–100.0)

## 2018-06-22 ENCOUNTER — Other Ambulatory Visit: Payer: Self-pay | Admitting: Physician Assistant

## 2018-06-22 ENCOUNTER — Encounter: Payer: Self-pay | Admitting: Family Medicine

## 2018-06-22 ENCOUNTER — Ambulatory Visit (INDEPENDENT_AMBULATORY_CARE_PROVIDER_SITE_OTHER): Payer: Medicare PPO | Admitting: Family Medicine

## 2018-06-22 VITALS — BP 124/75 | HR 71 | Ht 65.0 in | Wt 165.7 lb

## 2018-06-22 DIAGNOSIS — H9202 Otalgia, left ear: Secondary | ICD-10-CM | POA: Diagnosis not present

## 2018-06-22 NOTE — Progress Notes (Signed)
Acute Care Office visit  Assessment and plan:  1. h/o Acute otalgia, left     1. Left Otalgia - Sx resolved at this time - Reassurance given to patient today.  Possible causes for pt's symptoms reviewed and ear care discussed with patient today.  - Preventative care and prudent habits discussed with patient today.  - In the future, to keep ears clear, advised patient to use mixture of half hydrogen peroxide, half rubbing alcohol (1:1 ratio).  - Advised the patient to begin using AYR or Neilmed sinus rinses BID followed by flonase BID (one spray to each nostril).  Advised that the patient may also incorporate allegra or claritin PRN if she gets bad sinus congestion/ sinus HA come fall  2. Follow-Up - Call or RTC if new symptoms, or for future acute concerns PRN.   Gross side effects, risk and benefits, and alternatives of medications discussed with patient.  Patient is aware that all medications have potential side effects and we are unable to predict every sideeffect or drug-drug interaction that may occur.  Expresses verbal understanding and consents to current therapy plan and treatment regiment.   Education and routine counseling performed. Handouts provided.  Anticipatory guidance and routine counseling done re: condition, txmnt options and need for follow up. All questions of patient's were answered.  Return if symptoms worsen or fail to improve, for Please keep your chronic office visits you have for your regular follow-up of chronic conditions.  Please see AVS handed out to patient at the end of our visit for additional patient instructions/ counseling done pertaining to today's office visit.  Note:  This document was partially repared using Dragon voice recognition software and may include unintentional dictation errors.  This document serves as a record of services personally performed by Mellody Dance, DO. It was created on her behalf by Toni Amend, a  trained medical scribe. The creation of this record is based on the scribe's personal observations and the provider's statements to them.   I have reviewed the above medical documentation for accuracy and completeness and I concur.  Mellody Dance 06/22/18 2:12 PM  --------------------------------------------------------------------------------------------------------------------------------------------------------------------------------------------------------------------------     Subjective:    Chief Complaint  Patient presents with  . Ear Pain   HPI:  Pt presents with Sx that resolved, after 3 days of Sx last week. Woke up one morning; it "started throbbing" and lasted 3 days.   C/o:   Had an earache three days last week.  Was on the left side.  Notes she woke up after 3 days, and the pain had resolved.  While the symptoms persisted, she felt a little off-balance, but nothing else serious.  Patient is asymptomatic at this time.  Denies:  Symptoms now.    For symptoms patient has tried:   Took tylenol, two 500's.  Notes that this calmed her ear down and made it quit hurting, but when the tylenol ran out, the pain came back.  She also used some drops for her ear; something to clean the ear out.  Reviewed medicine in office today; it was a homeopathic chamomile and vegetable oil remedy meant to help with pain.  Second time she used the homeopathic drops, she got the feeling it was making the pain worse, so she didn't use them again.  Overall getting:  Feeling asymptomatic at this time.   Patient Care Team    Relationship Specialty Notifications Start End  Mellody Dance, DO PCP - General Family Medicine  06/11/17  Past medical history, Surgical history, Family history reviewed and noted below, Social history, Allergies, and Medications have been entered into the medical record, reviewed and changed as needed.   No Known Allergies  Review of Systems: - see  above HPI for pertinent positives General:   No F/C, wt loss Pulm:   No DIB, pleuritic chest pain Card:  No CP, palpitations Abd:  No n/v/d or pain Ext:  No inc edema from baseline   Objective:   Blood pressure 124/75, pulse 71, height 5' 5"  (1.651 m), weight 165 lb 11.2 oz (75.2 kg), SpO2 97 %. Body mass index is 27.57 kg/m. General: Well Developed, well nourished, appropriate for stated age.  Neuro: Alert and oriented x3, extra-ocular muscles intact, sensation grossly intact.  HEENT: Normocephalic, atraumatic, pupils equal round reactive to light, neck supple, no masses, no painful lymphadenopathy, TM's intact B/L, clear, good light reflex, no effusion, no air fluid levels. No acute findings.  Nares- patent, clear d/c,  OP- clear, mild erythema, No TTP sinuses Skin: Warm and dry, no gross rash. Vascular:  cap RF less 2 sec. Psych: No HI/SI, judgement and insight good, Euthymic mood. Full Affect.

## 2018-06-22 NOTE — Patient Instructions (Signed)
Use one half rubbing alcohol solution and one half hydroperoxide solution important to ear 2 times weekly to keep your ears cleaned of debris and wax.

## 2018-06-24 ENCOUNTER — Other Ambulatory Visit: Payer: Self-pay | Admitting: Family Medicine

## 2018-06-24 DIAGNOSIS — E7849 Other hyperlipidemia: Secondary | ICD-10-CM

## 2018-06-24 DIAGNOSIS — I10 Essential (primary) hypertension: Secondary | ICD-10-CM

## 2018-07-07 ENCOUNTER — Telehealth: Payer: Self-pay | Admitting: Family Medicine

## 2018-07-07 ENCOUNTER — Other Ambulatory Visit: Payer: Self-pay

## 2018-07-07 DIAGNOSIS — E119 Type 2 diabetes mellitus without complications: Secondary | ICD-10-CM

## 2018-07-07 MED ORDER — METFORMIN HCL 500 MG PO TABS: 250.0000 mg | ORAL_TABLET | Freq: Two times a day (BID) | ORAL | 1 refills | Status: DC

## 2018-07-07 NOTE — Telephone Encounter (Signed)
Refill sent into pharmacy, patient notified. MPulliam, CMA/RT(R)

## 2018-07-07 NOTE — Telephone Encounter (Signed)
Refill sent in for Metformin. MPulliam, CMA/RT(R)

## 2018-07-07 NOTE — Telephone Encounter (Signed)
Patient called and stated at last OV that she should have received a refill of her metformin to be sent to [facility] mail order Pharm. If approved can we please send this refill request in?

## 2018-07-09 ENCOUNTER — Inpatient Hospital Stay: Payer: Medicare PPO | Attending: Hematology & Oncology

## 2018-07-09 ENCOUNTER — Other Ambulatory Visit: Payer: Self-pay

## 2018-07-09 ENCOUNTER — Encounter: Payer: Self-pay | Admitting: Hematology & Oncology

## 2018-07-09 ENCOUNTER — Inpatient Hospital Stay (HOSPITAL_BASED_OUTPATIENT_CLINIC_OR_DEPARTMENT_OTHER): Payer: Medicare PPO | Admitting: Hematology & Oncology

## 2018-07-09 VITALS — BP 146/69 | HR 66 | Temp 98.1°F | Resp 18 | Wt 166.0 lb

## 2018-07-09 DIAGNOSIS — Z7984 Long term (current) use of oral hypoglycemic drugs: Secondary | ICD-10-CM

## 2018-07-09 DIAGNOSIS — K5190 Ulcerative colitis, unspecified, without complications: Secondary | ICD-10-CM

## 2018-07-09 DIAGNOSIS — C9190 Lymphoid leukemia, unspecified not having achieved remission: Secondary | ICD-10-CM

## 2018-07-09 DIAGNOSIS — Z79899 Other long term (current) drug therapy: Secondary | ICD-10-CM | POA: Diagnosis not present

## 2018-07-09 DIAGNOSIS — C9110 Chronic lymphocytic leukemia of B-cell type not having achieved remission: Secondary | ICD-10-CM

## 2018-07-09 LAB — CBC WITH DIFFERENTIAL (CANCER CENTER ONLY)
BASOS ABS: 0.1 10*3/uL (ref 0.0–0.1)
Basophils Relative: 0 %
Eosinophils Absolute: 0.1 10*3/uL (ref 0.0–0.5)
Eosinophils Relative: 0 %
HEMATOCRIT: 39.7 % (ref 34.8–46.6)
HEMOGLOBIN: 13.1 g/dL (ref 11.6–15.9)
LYMPHS PCT: 79 %
Lymphs Abs: 26.9 10*3/uL — ABNORMAL HIGH (ref 0.9–3.3)
MCH: 29.4 pg (ref 26.0–34.0)
MCHC: 33 g/dL (ref 32.0–36.0)
MCV: 89 fL (ref 81.0–101.0)
MONO ABS: 0.7 10*3/uL (ref 0.1–0.9)
MONOS PCT: 2 %
NEUTROS ABS: 6.4 10*3/uL (ref 1.5–6.5)
Neutrophils Relative %: 19 %
Platelet Count: 267 10*3/uL (ref 145–400)
RBC: 4.46 MIL/uL (ref 3.70–5.32)
RDW: 13.1 % (ref 11.1–15.7)
WBC Count: 34.2 10*3/uL — ABNORMAL HIGH (ref 3.9–10.0)

## 2018-07-09 LAB — CMP (CANCER CENTER ONLY)
ALBUMIN: 3.9 g/dL (ref 3.5–5.0)
ALK PHOS: 68 U/L (ref 26–84)
ALT: 28 U/L (ref 10–47)
AST: 28 U/L (ref 11–38)
Anion gap: 5 (ref 5–15)
BILIRUBIN TOTAL: 0.6 mg/dL (ref 0.2–1.6)
BUN: 10 mg/dL (ref 7–22)
CALCIUM: 9.4 mg/dL (ref 8.0–10.3)
CHLORIDE: 95 mmol/L — AB (ref 98–108)
CO2: 31 mmol/L (ref 18–33)
CREATININE: 0.9 mg/dL (ref 0.60–1.20)
Glucose, Bld: 118 mg/dL (ref 73–118)
Potassium: 4 mmol/L (ref 3.3–4.7)
Sodium: 131 mmol/L (ref 128–145)
TOTAL PROTEIN: 7 g/dL (ref 6.4–8.1)

## 2018-07-09 LAB — SAVE SMEAR

## 2018-07-09 NOTE — Progress Notes (Signed)
Hematology and Oncology Follow Up Visit  Mackenzie Barber [id number] 12-Sep-1941 77 y.o. 07/09/2018   Principle Diagnosis:   CLL -- Stage A  Current Therapy:    Observation     Interim History:  [name] is back for follow-up.  This is her second office visit.  We first saw her back in October 2018.  She is had no problems since we last saw her.  She does have ulcerative colitis.  She apparently had a flareup.  She sees gastroenterology in [place].  She has put on some medication for this.  She is had no problem with fever.  She is had no swollen lymph nodes.  She has had no cough or shortness of breath.  She has had no bleeding.  She has had no leg swelling.  She has had no headache.  She says she had a mammogram a couple months ago.  Everything looked fine.  For right now, her performance status is ECOG 0.  Medications:  Current Outpatient Medications:  .  cyanocobalamin 1000 MCG tablet, Take 1,000 mcg by mouth daily., Disp: , Rfl:  .  Alcohol Swabs (B-D SINGLE USE SWABS REGULAR) PADS, 1 Package by Does not apply route 2 (two) times daily., Disp: 100 each, Rfl: 0 .  atorvastatin (LIPITOR) 10 MG tablet, TAKE 1 TABLET EVERY DAY, Disp: 90 tablet, Rfl: 0 .  Blood Glucose Calibration (ACCU-CHEK AVIVA) SOLN, 1 drop by In Vitro route 2 (two) times daily., Disp: 1 each, Rfl: 11 .  Calcium Carbonate-Vitamin D 600-400 MG-UNIT tablet, Take 1 tablet by mouth 2 (two) times daily. ( take 1200 calcium per day), Disp: 60 tablet, Rfl: 11 .  Cholecalciferol (VITAMIN D3) 5000 units TABS, 5,000 IU OTC vitamin D3 daily., Disp: 90 tablet, Rfl: 3 .  glucose blood (ACCU-CHEK AVIVA) test strip, Use as instructed, Disp: 100 each, Rfl: 12 .  glycopyrrolate (ROBINUL-FORTE) 2 MG tablet, Take 1 tab by mouth twice daily for cramping and spasms., Disp: 60 tablet, Rfl: 3 .  hydrochlorothiazide (HYDRODIURIL) 25 MG tablet, TAKE 1 TABLET EVERY DAY, Disp: 90 tablet, Rfl: 0 .  Lancet Device MISC, 1 Device by Does  not apply route 2 (two) times daily., Disp: 1 each, Rfl: 11 .  losartan (COZAAR) 100 MG tablet, TAKE 1 TABLET EVERY DAY, Disp: 90 tablet, Rfl: 0 .  mesalamine (APRISO) 0.375 g 24 hr capsule, TAKE 4 CAPSULES EVERY DAY, Disp: 360 capsule, Rfl: 6 .  metFORMIN (GLUCOPHAGE) 500 MG tablet, Take 0.5 tablets (250 mg total) by mouth 2 (two) times daily with a meal., Disp: 90 tablet, Rfl: 1 .  Vitamin D, Ergocalciferol, (DRISDOL) 50000 units CAPS capsule, Take 1 capsule (50,000 Units total) by mouth every 7 (seven) days., Disp: 12 capsule, Rfl: 10  Allergies: No Known Allergies  Past Medical History, Surgical history, Social history, and Family History were reviewed and updated.  Review of Systems: Review of Systems  Constitutional: Negative.   HENT:  Negative.   Eyes: Negative.   Respiratory: Negative.   Cardiovascular: Negative.   Gastrointestinal: Negative.   Endocrine: Negative.   Genitourinary: Negative.    Musculoskeletal: Negative.   Skin: Negative.   Neurological: Negative.   Hematological: Negative.   Psychiatric/Behavioral: Negative.     Physical Exam:  weight is 166 lb (75.3 kg). Her oral temperature is 98.1 F (36.7 C). Her blood pressure is 146/69 (abnormal) and her pulse is 66. Her respiration is 18 and oxygen saturation is 96%.   Wt Readings from Last  3 Encounters:  07/09/18 166 lb (75.3 kg)  06/22/18 165 lb 11.2 oz (75.2 kg)  05/05/18 161 lb 9.6 oz (73.3 kg)    Physical Exam  Constitutional: She is oriented to person, place, and time.  HENT:  Head: Normocephalic and atraumatic.  Mouth/Throat: Oropharynx is clear and moist.  Eyes: Pupils are equal, round, and reactive to light. EOM are normal.  Neck: Normal range of motion.  Cardiovascular: Normal rate, regular rhythm and normal heart sounds.  Pulmonary/Chest: Effort normal and breath sounds normal.  Abdominal: Soft. Bowel sounds are normal.  Musculoskeletal: Normal range of motion. She exhibits no edema,  tenderness or deformity.  Lymphadenopathy:    She has no cervical adenopathy.  Neurological: She is alert and oriented to person, place, and time.  Skin: Skin is warm and dry. No rash noted. No erythema.  Psychiatric: She has a normal mood and affect. Her behavior is normal. Judgment and thought content normal.  Vitals reviewed.    Lab Results  Component Value Date   WBC 34.2 (H) 07/09/2018   HGB 13.1 07/09/2018   HCT 39.7 07/09/2018   MCV 89.0 07/09/2018   PLT 267 07/09/2018     Chemistry      Component Value Date/Time   NA 135 05/05/2018 0912   NA 137 08/14/2017 1216   K 4.4 05/05/2018 0912   K 3.7 08/14/2017 1216   CL 98 05/05/2018 0912   CL 101 08/14/2017 1216   CO2 24 05/05/2018 0912   CO2 28 08/14/2017 1216   BUN 9 05/05/2018 0912   BUN 8 08/14/2017 1216   CREATININE 0.69 05/05/2018 0912   CREATININE 0.8 08/14/2017 1216   GLU 122 11/21/2016      Component Value Date/Time   CALCIUM 9.8 05/05/2018 0912   CALCIUM 9.7 08/14/2017 1216   ALKPHOS 84 05/05/2018 0912   ALKPHOS 73 08/14/2017 1216   AST 20 05/05/2018 0912   AST 23 08/14/2017 1216   ALT 16 05/05/2018 0912   ALT 18 08/14/2017 1216   BILITOT 0.3 05/05/2018 0912   BILITOT 0.60 08/14/2017 1216       Impression and Plan: [name] is a 77 year old white female with CLL.  She has had this for about 6 years.  She is doing quite well with this.  I do not see any evidence that the CLL is getting worse.  Right now, we will plan to get her back in the spring 2020.  I think this would be reasonable.  I do not want her coming over any potentially bad weather during the wintertime.  I would be surprised if there are any issues with the CLL in the near future.   Volanda Napoleon, MD 8/29/20192:59 PM

## 2018-07-10 LAB — PROTEIN ELECTROPHORESIS, SERUM, WITH REFLEX
A/G RATIO SPE: 1.4 (ref 0.7–1.7)
Albumin ELP: 3.8 g/dL (ref 2.9–4.4)
Alpha-1-Globulin: 0.2 g/dL (ref 0.0–0.4)
Alpha-2-Globulin: 0.7 g/dL (ref 0.4–1.0)
BETA GLOBULIN: 1 g/dL (ref 0.7–1.3)
GAMMA GLOBULIN: 0.9 g/dL (ref 0.4–1.8)
Globulin, Total: 2.8 g/dL (ref 2.2–3.9)
TOTAL PROTEIN ELP: 6.6 g/dL (ref 6.0–8.5)

## 2018-07-10 LAB — IGG, IGA, IGM
IGG (IMMUNOGLOBIN G), SERUM: 901 mg/dL (ref 700–1600)
IGM (IMMUNOGLOBULIN M), SRM: 15 mg/dL — AB (ref 26–217)
IgA: 161 mg/dL (ref 64–422)

## 2018-07-10 LAB — LACTATE DEHYDROGENASE: LDH: 167 U/L (ref 98–192)

## 2018-08-25 ENCOUNTER — Other Ambulatory Visit: Payer: Self-pay | Admitting: Family Medicine

## 2018-08-25 DIAGNOSIS — I10 Essential (primary) hypertension: Secondary | ICD-10-CM

## 2018-08-25 DIAGNOSIS — E7849 Other hyperlipidemia: Secondary | ICD-10-CM

## 2018-09-07 ENCOUNTER — Ambulatory Visit: Payer: Medicare PPO | Admitting: Family Medicine

## 2018-10-06 ENCOUNTER — Encounter: Payer: Self-pay | Admitting: Family Medicine

## 2018-10-06 ENCOUNTER — Ambulatory Visit: Payer: Medicare PPO | Admitting: Family Medicine

## 2018-10-06 VITALS — BP 138/72 | HR 64 | Temp 97.4°F | Ht 65.0 in | Wt 166.4 lb

## 2018-10-06 DIAGNOSIS — Z23 Encounter for immunization: Secondary | ICD-10-CM

## 2018-10-06 DIAGNOSIS — E119 Type 2 diabetes mellitus without complications: Secondary | ICD-10-CM | POA: Diagnosis not present

## 2018-10-06 DIAGNOSIS — E1169 Type 2 diabetes mellitus with other specified complication: Secondary | ICD-10-CM

## 2018-10-06 DIAGNOSIS — Z79899 Other long term (current) drug therapy: Secondary | ICD-10-CM | POA: Diagnosis not present

## 2018-10-06 DIAGNOSIS — E538 Deficiency of other specified B group vitamins: Secondary | ICD-10-CM | POA: Diagnosis not present

## 2018-10-06 DIAGNOSIS — E785 Hyperlipidemia, unspecified: Secondary | ICD-10-CM

## 2018-10-06 DIAGNOSIS — I10 Essential (primary) hypertension: Secondary | ICD-10-CM

## 2018-10-06 DIAGNOSIS — Z8349 Family history of other endocrine, nutritional and metabolic diseases: Secondary | ICD-10-CM | POA: Diagnosis not present

## 2018-10-06 DIAGNOSIS — E1159 Type 2 diabetes mellitus with other circulatory complications: Secondary | ICD-10-CM | POA: Diagnosis not present

## 2018-10-06 DIAGNOSIS — I152 Hypertension secondary to endocrine disorders: Secondary | ICD-10-CM

## 2018-10-06 LAB — POCT GLYCOSYLATED HEMOGLOBIN (HGB A1C): HEMOGLOBIN A1C: 6.2 % — AB (ref 4.0–5.6)

## 2018-10-06 NOTE — Progress Notes (Signed)
Impression and Recommendations:    1. Diabetes mellitus without complication ([place])   2. Hypertension associated with diabetes ([place])   3. Hyperlipidemia associated with type 2 diabetes mellitus ([place])   4. Family history of B12 deficiency   5. High risk medication use   6. Need for influenza vaccination   7. B12 deficiency     - Need for flu shot today.   1. Diabetes Mellitus -  A1c at 6.2 today, unchanged from prior. - Discussed that this is a very well-controlled A1c. - Blood sugar well-controlled at this time.  No changes made to treatment today. - Continue current medication(s). See med list today.  - Counseled patient on pathophysiology of disease and discussed various treatment options, including ongoing dietary and lifestyle modifications as first line.  Importance of low carb/ketogenic diet discussed with patient in addition to regular exercise.   - Encouraged patient to check FBS and 2 hours after the biggest meal of her day.  Keep log and bring in next OV for my review.   Also, if you ever feel poorly, please check your blood pressure and blood sugar, as one or the other could be the cause of your symptoms.  - Being a diabetic, you need yearly eye and foot exams. Make appt.for diabetic eye exam.  2. Vitamin D - Was at 52.1 last check. - Continue supplementation as prescribed. - Refill ordered today.  3. Vitamin B12 Deficiency - Labs drawn to assess for B12 deficiency and magnesium deficiency. - Last B12 in Feburary of 2019 was around 283. - Per pt, has been on supplements since February, per GI doctor, Nandigam.   4. Blood Pressure - Patient asymptomatic at this time, well-controlled. - No changes to treatment made today.  Continue current medications. - See med list below. - Patient tolerating meds well without complication.  Denies S-E  5. Cholesterol - Last Lipid Panel, LDL at goal, well-controlled. - Continue taking Lipitor nightly before bed. - No  changes to treatment made at this time.  - Dietary changes such as low saturated & trans fat and low carb/ ketogenic diets discussed with patient.  Encouraged regular exercise and weight loss when appropriate.   6. Specialty Care - Colitis/GI - Patient knows to continue to obtain follow-up care through [facility].  - Emphasized importance of continued specialty management.  7. BMI Counseling Explained to patient what BMI refers to, and what it means medically.    Told patient to think about it as a "medical risk stratification measurement" and how increasing BMI is associated with increasing risk/ or worsening state of various diseases such as hypertension, hyperlipidemia, diabetes, premature OA, depression etc.  American Heart Association guidelines for healthy diet, basically Mediterranean diet, and exercise guidelines of 30 minutes 5 days per week or more discussed in detail.  Health counseling performed.  All questions answered.  8. Lifestyle & Preventative Health Maintenance - Advised patient to continue working toward exercising to improve overall mental, physical, and emotional health.    - Reviewed the "spokes of the wheel" of mood and health management.  Stressed the importance of ongoing prudent habits, including regular exercise, appropriate sleep hygiene, healthful dietary habits, and prayer/meditation to relax.  - Encouraged patient to engage in daily physical activity, especially a formal exercise routine.  Recommended that the patient eventually strive for at least 150 minutes of moderate cardiovascular activity per week according to guidelines established by the [facility].   - Healthy dietary habits encouraged, including  low-carb, and high amounts of lean protein in diet.   - Patient should also consume adequate amounts of water.   Education and routine counseling performed. Handouts provided.  9. Follow Up - Continue to follow up with specialists as recommended. -  Prescriptions provided and refilled today PRN. - Re-check fasting lab work as recommended. - Otherwise, continue to return for CPE and chronic follow-up as scheduled.   - Patient knows to call in sooner if desired to address acute concerns.   Orders Placed This Encounter  Procedures  . Flu vaccine HIGH DOSE PF (Fluzone High dose)  . Magnesium  . B12  . Microalbumin / creatinine urine ratio  . POCT glycosylated hemoglobin (Hb A1C)    Medications Discontinued During This Encounter  Medication Reason  . Vitamin D, Ergocalciferol, (DRISDOL) 50000 units CAPS capsule Completed Course      The patient was counseled, risk factors were discussed, anticipatory guidance given.  Gross side effects, risk and benefits, and alternatives of medications discussed with patient.  Patient is aware that all medications have potential side effects and we are unable to predict every side effect or drug-drug interaction that may occur.  Expresses verbal understanding and consents to current therapy plan and treatment regimen.   Return for DM, HTN, HLD follow up every 4 mo.   Please see AVS handed out to patient at the end of our visit for further patient instructions/ counseling done pertaining to today's office visit.    Note:  This document was prepared using Dragon voice recognition software and may include unintentional dictation errors.   This document serves as a record of services personally performed by Mellody Dance, DO. It was created on her behalf by Toni Amend, a trained medical scribe. The creation of this record is based on the scribe's personal observations and the provider's statements to them.   I have reviewed the above medical documentation for accuracy and completeness and I concur.  Mellody Dance, DO 10/06/2018 6:53 PM      Subjective:    Chief Complaint  Patient presents with  . Diabetes  . Hypertension     Mackenzie Barber is a 77 y.o. female who  presents to [place] [facility] today for Diabetes Management.    Patient missed her last appointment by accident because she was out of town.  States she feels she has been sluggish lately.  Patient does not check her blood pressure at home, "not much."  States "It's never anything to be concerned about when I do."  Possible B12 deficiency Patient states that she was told she had a B12 deficiency by her colon doctor.  Was told "it's below what it should be" by the gastroenterologist, Dr. Silverio Decamp, and advised to begin supplementation.  DM HPI: -  She has been working on diet and exercise for diabetes.  Notes no changes from prior.  Pt is currently maintained on the following medications for diabetes:  see med list today.  States "seems like that metformin is doing a good job."  Medication compliance - continues on treatment plan as prescribed.  Home glucose readings range not checking   Denies polyuria/polydipsia. Denies hypo/ hyperglycemia symptoms - She denies new onset of: chest pain, exercise intolerance, shortness of breath, dizziness, visual changes, headache, lower extremity swelling or claudication.   Last diabetic eye exam was No results found for: HMDIABEYEEXA  Foot exam- UTD  Last A1C in the office was:  Lab Results  Component  Value Date   HGBA1C 6.2 (A) 10/06/2018   HGBA1C 6.2 05/05/2018   HGBA1C 6.1 (H) 11/20/2017    Lab Results  Component Value Date   LDLCALC 63 05/05/2018   CREATININE 0.90 07/09/2018      Last 3 blood pressure readings in our office are as follows: BP Readings from Last 3 Encounters:  10/06/18 138/72  07/09/18 (!) 146/69  06/22/18 124/75    BMI Readings from Last 3 Encounters:  10/06/18 27.69 kg/m  07/09/18 27.62 kg/m  06/22/18 27.57 kg/m     Problem  Family History of B12 Deficiency      Patient Care Team    Relationship Specialty Notifications Start End  Mellody Dance, DO PCP - General  Family Medicine  06/11/17      Patient Active Problem List   Diagnosis Date Noted  . Diabetes mellitus without complication ([place]) 69/48/5462    Priority: High  . Hypertension associated with diabetes ([place]) 06/11/2017    Priority: Medium  . Hyperlipidemia associated with type 2 diabetes mellitus ([place]) 06/11/2017    Priority: Medium  . Ulcerative colitis ([place]) 06/11/2017    Priority: Medium  . CLL (chronic lymphocytic leukemia) ([place]) 06/11/2017    Priority: Low  . Polyp of colon 06/11/2017    Priority: Low  . Family history of B12 deficiency 10/06/2018  . History of irregular heartbeat-  infreq episodes and asymptomatic lasting less than 1-2 minutes 05/05/2018  . Counseling on health promotion and disease prevention 11/20/2017  . Hyperkeratotic Skin lesion of right leg- changing recently 11/20/2017  . Vitamin D insufficiency 06/24/2017  . GERD (gastroesophageal reflux disease) 06/11/2017  . S/P hysterectomy- about 59yr ago 06/11/2017  . Other fatigue 06/11/2017  . Osteoporosis screening 06/11/2017     Past Medical History:  Diagnosis Date  . Diabetes mellitus without complication ([place]   . Hyperlipidemia   . Hypertension   . 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 ABDOMINAL HYSTERECTOMY    . TONSILECTOMY/ADENOIDECTOMY WITH MYRINGOTOMY       Family History  Problem Relation Age of Onset  . Cancer Mother        colon  . Hyperlipidemia Mother   . Hypertension Mother      Social History   Substance and Sexual Activity  Drug Use No  ,  Social History   Substance and Sexual Activity  Alcohol Use No  ,  Social History   Tobacco Use  Smoking Status Never Smoker  Smokeless Tobacco Never Used  ,    Current Outpatient Medications on File Prior to Visit  Medication Sig Dispense Refill  . Alcohol Swabs (B-D SINGLE USE SWABS REGULAR) PADS 1 Package by Does not apply route 2 (two) times daily. 100 each 0  .  atorvastatin (LIPITOR) 10 MG tablet TAKE 1 TABLET EVERY DAY 90 tablet 0  . Blood Glucose Calibration (ACCU-CHEK AVIVA) SOLN 1 drop by In Vitro route 2 (two) times daily. 1 each 11  . Calcium Carbonate-Vitamin D 600-400 MG-UNIT tablet Take 1 tablet by mouth 2 (two) times daily. ( take 1200 calcium per day) 60 tablet 11  . Cholecalciferol (VITAMIN D3) 5000 units TABS 5,000 IU OTC vitamin D3 daily. 90 tablet 3  . cyanocobalamin 1000 MCG tablet Take 1,000 mcg by mouth daily.    .Marland Kitchenglucose blood (ACCU-CHEK AVIVA) test strip Use as instructed 100 each 12  . glycopyrrolate (ROBINUL-FORTE) 2 MG tablet Take 1 tab by mouth  twice daily for cramping and spasms. 60 tablet 3  . hydrochlorothiazide (HYDRODIURIL) 25 MG tablet TAKE 1 TABLET EVERY DAY 90 tablet 0  . Lancet Device MISC 1 Device by Does not apply route 2 (two) times daily. 1 each 11  . losartan (COZAAR) 100 MG tablet TAKE 1 TABLET EVERY DAY 90 tablet 0  . mesalamine (APRISO) 0.375 g 24 hr capsule TAKE 4 CAPSULES EVERY DAY 360 capsule 6  . metFORMIN (GLUCOPHAGE) 500 MG tablet Take 0.5 tablets (250 mg total) by mouth 2 (two) times daily with a meal. 90 tablet 1   No current facility-administered medications on file prior to visit.      No Known Allergies   Review of Systems:   General:  Denies fever, chills Optho/Auditory:   Denies visual changes, blurred vision Respiratory:   Denies SOB, cough, wheeze, DIB  Cardiovascular:   Denies chest pain, palpitations, painful respirations Gastrointestinal:   Denies nausea, vomiting, diarrhea.  Endocrine:     Denies new hot or cold intolerance Musculoskeletal:  Denies joint swelling, gait issues, or new unexplained myalgias/ arthralgias Skin:  Denies rash, suspicious lesions  Neurological:    Denies dizziness, unexplained weakness, numbness  Psychiatric/Behavioral:   Denies mood changes    Objective:     Blood pressure 138/72, pulse 64, temperature (!) 97.4 F (36.3 C), temperature source Oral,  height 5' 5"  (1.651 m), weight 166 lb 6.4 oz (75.5 kg), SpO2 96 %.  Body mass index is 27.69 kg/m.  General: Well Developed, well nourished, and in no acute distress.  HEENT: Normocephalic, atraumatic, pupils equal round reactive to light, neck supple, No carotid bruits, no JVD Skin: Warm and dry, cap RF less 2 sec Cardiac: Regular rate and rhythm, S1, S2 WNL's, no murmurs rubs or gallops Respiratory: ECTA B/L, Not using accessory muscles, speaking in full sentences. NeuroM-Sk: Ambulates w/o assistance, moves ext * 4 w/o difficulty, sensation grossly intact.  Ext: scant edema b/l lower ext Psych: No HI/SI, judgement and insight good, Euthymic mood. Full Affect.

## 2018-10-06 NOTE — Patient Instructions (Addendum)
Today patient needs a magnesium, B12 level today  Also needs her flu   Patient also needs a urine microalbumin to creatinine ratio for her diabetes which she has not had one last 12 months.    Diabetes Mellitus and Standards of Medical Care  Managing diabetes (diabetes mellitus) can be complicated. Your diabetes treatment may be managed by a team of health care providers, including:  A diet and nutrition specialist (registered dietitian).  A nurse.  A certified diabetes educator (CDE).  A diabetes specialist (endocrinologist).  An eye doctor.  A primary care provider.  A dentist.  Your health care providers follow a schedule in order to help you get the best quality of care. The following schedule is a general guideline for your diabetes management plan. Your health care providers may also give you more specific instructions.  HbA1c (hemoglobin A1c) test This test provides information about blood sugar (glucose) control over the previous 2-3 months. It is used to check whether your diabetes management plan needs to be adjusted.  If you are meeting your treatment goals, this test is done at least 2 times a year.  If you are not meeting treatment goals or if your treatment goals have changed, this test is done 4 times a year.  Blood pressure test  This test is done at every routine medical visit. For most people, the goal is less than 130/80. Ask your health care provider what your goal blood pressure should be.  Dental and eye exams  Visit your dentist two times a year.  If you have type 1 diabetes, get an eye exam 3-5 years after you are diagnosed, and then once a year after your first exam. ? If you were diagnosed with type 1 diabetes as a child, get an eye exam when you are age 68 or older and have had diabetes for 3-5 years. After the first exam, you should get an eye exam once a year.  If you have type 2 diabetes, have an eye exam as soon as you are diagnosed, and  then once a year after your first exam.  Foot care exam  Visual foot exams are done at every routine medical visit. The exams check for cuts, bruises, redness, blisters, sores, or other problems with the feet.  A complete foot exam is done by your health care provider once a year. This exam includes an inspection of the structure and skin of your feet, and a check of the pulses and sensation in your feet. ? Type 1 diabetes: Get your first exam 3-5 years after diagnosis. ? Type 2 diabetes: Get your first exam as soon as you are diagnosed.  Check your feet every day for cuts, bruises, redness, blisters, or sores. If you have any of these or other problems that are not healing, contact your health care provider.  Kidney function test (urine microalbumin)  This test is done once a year. ? Type 1 diabetes: Get your first test 5 years after diagnosis. ? Type 2 diabetes: Get your first test as soon as you are diagnosed._  If you have chronic kidney disease (CKD), get a serum creatinine and estimated glomerular filtration rate (eGFR) test once a year.  Lipid profile (cholesterol, HDL, LDL, triglycerides)  This test should be done when you are diagnosed with diabetes, and every 5 years after the first test. If you are on medicines to lower your cholesterol, you may need to get this test done every year. ? The goal for  LDL is less than 100 mg/dL (5.5 mmol/L). If you are at high risk, the goal is less than 70 mg/dL (3.9 mmol/L). ? The goal for HDL is 40 mg/dL (2.2 mmol/L) for men and 50 mg/dL(2.8 mmol/L) for women. An HDL cholesterol of 60 mg/dL (3.3 mmol/L) or higher gives some protection against heart disease. ? The goal for triglycerides is less than 150 mg/dL (8.3 mmol/L).  Immunizations  The yearly flu (influenza) vaccine is recommended for everyone 6 months or older who has diabetes.  The pneumonia (pneumococcal) vaccine is recommended for everyone 2 years or older who has diabetes. If you  are 62 or older, you may get the pneumonia vaccine as a series of two separate shots.  The hepatitis B vaccine is recommended for adults shortly after they have been diagnosed with diabetes.  The Tdap (tetanus, diphtheria, and pertussis) vaccine should be given: ? According to normal childhood vaccination schedules, for children. ? Every 10 years, for adults who have diabetes.  The shingles vaccine is recommended for people who have had chicken pox and are 50 years or older.  Mental and emotional health  Screening for symptoms of eating disorders, anxiety, and depression is recommended at the time of diagnosis and afterward as needed. If your screening shows that you have symptoms (you have a positive screening result), you may need further evaluation and be referred to a mental health care provider.  Diabetes self-management education  Education about how to manage your diabetes is recommended at diagnosis and ongoing as needed.  Treatment plan  Your treatment plan will be reviewed at every medical visit.  Summary  Managing diabetes (diabetes mellitus) can be complicated. Your diabetes treatment may be managed by a team of health care providers.  Your health care providers follow a schedule in order to help you get the best quality of care.  Standards of care including having regular physical exams, blood tests, blood pressure monitoring, immunizations, screening tests, and education about how to manage your diabetes.  Your health care providers may also give you more specific instructions based on your individual health.      Type 2 Diabetes Mellitus, Self Care, Adult Caring for yourself after you have been diagnosed with type 2 diabetes (type 2 diabetes mellitus) means keeping your blood sugar (glucose) under control with a balance of:  Nutrition.  Exercise.  Lifestyle changes.  Medicines or insulin, if necessary.  Support from your team of health care providers and  others.  The following information explains what you need to know to manage your diabetes at home. What do I need to do to manage my blood glucose?  Check your blood glucose every day, as often as told by your health care provider.  Contact your health care provider if your blood glucose is above your target for 2 tests in a row.  Have your A1c (hemoglobin A1c) level checked at least two times a year, or as often as told by your health care provider. Your health care provider will set individualized treatment goals for you. Generally, the goal of treatment is to maintain the following blood glucose levels:  Before meals (preprandial): 80-130 mg/dL (4.4-7.2 mmol/L).  After meals (postprandial): below 180 mg/dL (10 mmol/L).  A1c level: less than 7%.  What do I need to know about hyperglycemia and hypoglycemia? What is hyperglycemia? Hyperglycemia, also called high blood glucose, occurs when blood glucose is too high.Make sure you know the early signs of hyperglycemia, such as:  Increased thirst.  Hunger.  Feeling very tired.  Needing to urinate more often than usual.  Blurry vision.  What is hypoglycemia? Hypoglycemia, also called low blood glucose, occurswith a blood glucose level at or below 70 mg/dL (3.9 mmol/L). The risk for hypoglycemia increases during or after exercise, during sleep, during illness, and when skipping meals or not eating for a long time (fasting). It is important to know the symptoms of hypoglycemia and treat it right away. Always have a 15-gram rapid-acting carbohydrate snack with you to treat low blood glucose. Family members and close friends should also know the symptoms and should understand how to treat hypoglycemia, in case you are not able to treat yourself. What are the symptoms of hypoglycemia? Hypoglycemia symptoms can include:  Hunger.  Anxiety.  Sweating and feeling clammy.  Confusion.  Dizziness or feeling  light-headed.  Sleepiness.  Nausea.  Increased heart rate.  Headache.  Blurry vision.  Seizure.  Nightmares.  Tingling or numbness around the mouth, lips, or tongue.  A change in speech.  Decreased ability to concentrate.  A change in coordination.  Restless sleep.  Tremors or shakes.  Fainting.  Irritability.  How do I treat hypoglycemia?  If you are alert and able to swallow safely, follow the 15:15 rule:  Take 15 grams of a rapid-acting carbohydrate. Rapid-acting options include: ? 1 tube of glucose gel. ? 3 glucose pills. ? 6-8 pieces of hard candy. ? 4 oz (120 mL) of fruit juice. ? 4 oz (120 mL) of regular (not diet) soda.  Check your blood glucose 15 minutes after you take the carbohydrate.  If the repeat blood glucose level is still at or below 70 mg/dL (3.9 mmol/L), take 15 grams of a carbohydrate again.  If your blood glucose level does not increase above 70 mg/dL (3.9 mmol/L) after 3 tries, seek emergency medical care.  After your blood glucose level returns to normal, eat a meal or a snack within 1 hour.  How do I treat severe hypoglycemia? Severe hypoglycemia is when your blood glucose level is at or below 54 mg/dL (3 mmol/L). Severe hypoglycemia is an emergency. Do not wait to see if the symptoms will go away. Get medical help right away. Call your local emergency services (911 in the [place] Do not drive yourself to the hospital. If you have severe hypoglycemia and you cannot eat or drink, you may need an injection of glucagon. A family member or close friend should learn how to check your blood glucose and how to give you a glucagon injection. Ask your health care provider if you need to have an emergency glucagon injection kit available. Severe hypoglycemia may need to be treated in a hospital. The treatment may include getting glucose through an IV tube. You may also need treatment for the cause of your hypoglycemia. Can having diabetes put me at  risk for other conditions? Having diabetes can put you at risk for other long-term (chronic) conditions, such as heart disease and kidney disease. Your health care provider may prescribe medicines to help prevent complications from diabetes. These medicines may include:  Aspirin.  Medicine to lower cholesterol.  Medicine to control blood pressure.  What else can I do to manage my diabetes? Take your diabetes medicines as told  If your health care provider prescribed insulin or diabetes medicines, take them every day.  Do not run out of insulin or other diabetes medicines that you take. Plan ahead so you always have these available.  If you use  insulin, adjust your dosage based on how physically active you are and what foods you eat. Your health care provider will tell you how to adjust your dosage. Make healthy food choices  The things that you eat and drink affect your blood glucose and your insulin dosage. Making good choices helps to control your diabetes and prevent other health problems. A healthy meal plan includes eating lean proteins, complex carbohydrates, fresh fruits and vegetables, low-fat dairy products, and healthy fats. Make an appointment to see a diet and nutrition specialist (registered dietitian) to help you create an eating plan that is right for you. Make sure that you:  Follow instructions from your health care provider about eating or drinking restrictions.  Drink enough fluid to keep your urine clear or pale yellow.  Eat healthy snacks between nutritious meals.  Track the carbohydrates that you eat. Do this by reading food labels and learning the standard serving sizes of foods.  Follow your sick day plan whenever you cannot eat or drink as usual. Make this plan in advance with your health care provider.  Stay active  Exercise regularly, as told by your health care provider. This may include:  Stretching and doing strength exercises, such as yoga or  weightlifting, at least 2 times a week.  Doing at least 150 minutes of moderate-intensity or vigorous-intensity exercise each week. This could be brisk walking, biking, or water aerobics. ? Spread out your activity over at least 3 days of the week. ? Do not go more than 2 days in a row without doing some kind of physical activity.  When you start a new exercise or activity, work with your health care provider to adjust your insulin, medicines, or food intake as needed. Make healthy lifestyle choices  Do not use any tobacco products, such as cigarettes, chewing tobacco, and e-cigarettes. If you need help quitting, ask your health care provider.  If your health care provider says that alcohol is safe for you, limit alcohol intake to no more than 1 drink per day for nonpregnant women and 2 drinks per day for men. One drink equals 12 oz of beer, 5 oz of wine, or 1 oz of hard liquor.  Learn to manage stress. If you need help with this, ask your health care provider. Care for your body   Keep your immunizations up to date. In addition to getting vaccinations as told by your health care provider, it is recommended that you get vaccinated against the following illnesses: ? The flu (influenza). Get a flu shot every year. ? Pneumonia. ? Hepatitis B.  Schedule an eye exam soon after your diagnosis, and then one time every year after that.  Check your skin and feet every day for cuts, bruises, redness, blisters, or sores. Schedule a foot exam with your health care provider once every year.  Brush your teeth and gums two times a day, and floss at least one time a day. Visit your dentist at least once every 6 months.  Maintain a healthy weight. General instructions  Take over-the-counter and prescription medicines only as told by your health care provider.  Share your diabetes management plan with people in your workplace, school, and household.  Check your urine for ketones when you are ill  and as told by your health care provider.  Ask your health care provider: ? Do I need to meet with a diabetes educator? ? Where can I find a support group for people with diabetes?  Carry a medical  alert card or wear medical alert jewelry.  Keep all follow-up visits as told by your health care provider. This is important. Where to find more information: For more information about diabetes, visit:  American Diabetes Association (ADA): www.diabetes.org  American Association of Diabetes Educators (AADE): www.diabeteseducator.org/patient-resources  This information is not intended to replace advice given to you by your health care provider. Make sure you discuss any questions you have with your health care provider. Document Released: 02/19/2016 Document Revised: 04/04/2016 Document Reviewed: 12/01/2015 Elsevier Interactive Patient Education  2017 [place].      Blood Glucose Monitoring, Adult Monitoring your blood sugar (glucose) helps you manage your diabetes. It also helps you and your health care provider determine how well your diabetes management plan is working. Blood glucose monitoring involves checking your blood glucose as often as directed, and keeping a record (log) of your results over time. Why should I monitor my blood glucose? Checking your blood glucose regularly can:  Help you understand how food, exercise, illnesses, and medicines affect your blood glucose.  Let you know what your blood glucose is at any time. You can quickly tell if you are having low blood glucose (hypoglycemia) or high blood glucose (hyperglycemia).  Help you and your health care provider adjust your medicines as needed.  When should I check my blood glucose? Follow instructions from your health care provider about how often to check your blood glucose.   This may depend on:  The type of diabetes you have.  How well-controlled your diabetes is.  Medicines you are taking.  If you have  type 1 diabetes:  Check your blood glucose at least 2 times a day.  Also check your blood glucose: ? Before every insulin injection. ? Before and after exercise. ? Between meals. ? 2 hours after a meal. ? Occasionally between 2:00 a.m. and 3:00 a.m., as directed. ? Before potentially dangerous tasks, like driving or using heavy machinery. ? At bedtime.  You may need to check your blood glucose more often, up to 6-10 times a day: ? If you use an insulin pump. ? If you need multiple daily injections (MDI). ? If your diabetes is not well-controlled. ? If you are ill. ? If you have a history of severe hypoglycemia. ? If you have a history of not knowing when your blood glucose is getting low (hypoglycemia unawareness).  If you have type 2 diabetes:  If you take insulin or other diabetes medicines, check your blood glucose at least 2 times a day.  If you are on intensive insulin therapy, check your blood glucose at least 4 times a day. Occasionally, you may also need to check between 2:00 a.m. and 3:00 a.m., as directed.  Also check your blood glucose: ? Before and after exercise. ? Before potentially dangerous tasks, like driving or using heavy machinery.  You may need to check your blood glucose more often if: ? Your medicine is being adjusted. ? Your diabetes is not well-controlled. ? You are ill.  What is a blood glucose log?  A blood glucose log is a record of your blood glucose readings. It helps you and your health care provider: ? Look for patterns in your blood glucose over time. ? Adjust your diabetes management plan as needed.  Every time you check your blood glucose, write down your result and notes about things that may be affecting your blood glucose, such as your diet and exercise for the day.  Most glucose meters store  a record of glucose readings in the meter. Some meters allow you to download your records to a computer. How do I check my blood  glucose? Follow these steps to get accurate readings of your blood glucose: Supplies needed   Blood glucose meter.  Test strips for your meter. Each meter has its own strips. You must use the strips that come with your meter.  A needle to prick your finger (lancet). Do not use lancets more than once.  A device that holds the lancet (lancing device).  A journal or log book to write down your results.  Procedure  Wash your hands with soap and water.  Prick the side of your finger (not the tip) with the lancet. Use a different finger each time.  Gently rub the finger until a small drop of blood appears.  Follow instructions that come with your meter for inserting the test strip, applying blood to the strip, and using your blood glucose meter.  Write down your result and any notes.  Alternative testing sites  Some meters allow you to use areas of your body other than your finger (alternative sites) to test your blood.  If you think you may have hypoglycemia, or if you have hypoglycemia unawareness, do not use alternative sites. Use your finger instead.  Alternative sites may not be as accurate as the fingers, because blood flow is slower in these areas. This means that the result you get may be delayed, and it may be different from the result that you would get from your finger.  The most common alternative sites are: ? Forearm. ? Thigh. ? Palm of the hand.  Additional tips  Always keep your supplies with you.  If you have questions or need help, all blood glucose meters have a 24-hour "hotline" number that you can call. You may also contact your health care provider.  After you use a few boxes of test strips, adjust (calibrate) your blood glucose meter by following instructions that came with your meter.    The American Diabetes Association suggests the following targets for most nonpregnant adults with diabetes.  More or less stringent glycemic goals may be appropriate  for each individual.  A1C: Less than 7% A1C may also be reported as eAG: Less than 154 mg/dl Before a meal (preprandial plasma glucose): 80-130 mg/dl 1-2 hours after beginning of the meal (Postprandial plasma glucose)*: Less than 180 mg/dl  *Postprandial glucose may be targeted if A1C goals are not met despite reaching preprandial glucose goals.   GOALS in short:  The goals are for the Hgb A1C to be less than 7.0 & blood pressure to be less than 130/80.    It is recommended that all diabetics are educated on and follow a healthy diabetic diet, exercise for 30 minutes 3-4 times per week (walking, biking, swimming, or machine), monitor blood glucose readings and bring that record with you to be reviewed at your next office visit.     You should be checking fasting blood sugars- especially after you eat poorly or eat really healthy, and also check 2 hour postprandial blood sugars after largest meal of the day.    Write these down and bring in your log at each office visit.    You will need to be seen every 3 months by the provider managing your Diabetes unless told otherwise by that provider.   You will need yearly eye exams from an eye specialist and foot exams to check the nerves of  your feet.  Also, your urine should be checked yearly as well to make sure excess protein is not present.   If you are checking your blood pressure at home, please record it and bring it to your next office visit.    Follow the Dietary Approaches to Stop Hypertension (DASH) diet (3 servings of fruit and vegetables daily, whole grains, low sodium, low-fat proteins).  See below.    Lastly, when it comes to your cholesterol, the goal is to have the HDL (good cholesterol) >40, and the LDL (bad cholesterol) <100.   It is recommended that you follow a heart healthy, low saturated and trans-fat diet and exercise for 30 minutes at least 5 times a week.     (( Check out the DASH diet = 1.5 Gram Low Sodium Diet   A 1.5  gram sodium diet restricts the amount of sodium in the diet to no more than 1.5 g or 1500 mg daily.  The American Heart Association recommends Americans over the age of 18 to consume no more than 1500 mg of sodium each day to reduce the risk of developing high blood pressure.  Research also shows that limiting sodium may reduce heart attack and stroke risk.  Many foods contain sodium for flavor and sometimes as a preservative.  When the amount of sodium in a diet needs to be low, it is important to know what to look for when choosing foods and drinks.  The following includes some information and guidelines to help make it easier for you to adapt to a low sodium diet.    QUICK TIPS  Do not add salt to food.  Avoid convenience items and fast food.  Choose unsalted snack foods.  Buy lower sodium products, often labeled as "lower sodium" or "no salt added."  Check food labels to learn how much sodium is in 1 serving.  When eating at a restaurant, ask that your food be prepared with less salt or none, if possible.    READING FOOD LABELS FOR SODIUM INFORMATION  The nutrition facts label is a good place to find how much sodium is in foods. Look for products with no more than 400 mg of sodium per serving.  Remember that 1.5 g = 1500 mg.  The food label may also list foods as:  Sodium-free: Less than 5 mg in a serving.  Very low sodium: 35 mg or less in a serving.  Low-sodium: 140 mg or less in a serving.  Light in sodium: 50% less sodium in a serving. For example, if a food that usually has 300 mg of sodium is changed to become light in sodium, it will have 150 mg of sodium.  Reduced sodium: 25% less sodium in a serving. For example, if a food that usually has 400 mg of sodium is changed to reduced sodium, it will have 300 mg of sodium.    CHOOSING FOODS  Grains  Avoid: Salted crackers and snack items. Some cereals, including instant hot cereals. Bread stuffing and biscuit mixes. Seasoned rice or  pasta mixes.  Choose: Unsalted snack items. Low-sodium cereals, oats, puffed wheat and rice, shredded wheat. English muffins and bread. Pasta.  Meats  Avoid: Salted, canned, smoked, spiced, pickled meats, including fish and poultry. Bacon, ham, sausage, cold cuts, hot dogs, anchovies.  Choose: Low-sodium canned tuna and salmon. Fresh or frozen meat, poultry, and fish.  Dairy  Avoid: Processed cheese and spreads. Cottage cheese. Buttermilk and condensed milk. Regular cheese.  Choose:  Milk. Low-sodium cottage cheese. Yogurt. Sour cream. Low-sodium cheese.  Fruits and Vegetables  Avoid: Regular canned vegetables. Regular canned tomato sauce and paste. Frozen vegetables in sauces. Olives. Angie Fava. Relishes. Sauerkraut.  Choose: Low-sodium canned vegetables. Low-sodium tomato sauce and paste. Frozen or fresh vegetables. Fresh and frozen fruit.  Condiments  Avoid: Canned and packaged gravies. Worcestershire sauce. Tartar sauce. Barbecue sauce. Soy sauce. Steak sauce. Ketchup. Onion, garlic, and table salt. Meat flavorings and tenderizers.  Choose: Fresh and dried herbs and spices. Low-sodium varieties of mustard and ketchup. Lemon juice. Tabasco sauce. Horseradish.    SAMPLE 1.5 GRAM SODIUM MEAL PLAN:   Breakfast / Sodium (mg)  1 cup low-fat milk / 143 mg  1 whole-wheat English muffin / 240 mg  1 tbs heart-healthy margarine / 153 mg  1 hard-boiled egg / 139 mg  1 small orange / 0 mg  Lunch / Sodium (mg)  1 cup raw carrots / 76 mg  2 tbs no salt added peanut butter / 5 mg  2 slices whole-wheat bread / 270 mg  1 tbs jelly / 6 mg   cup red grapes / 2 mg  Dinner / Sodium (mg)  1 cup whole-wheat pasta / 2 mg  1 cup low-sodium tomato sauce / 73 mg  3 oz lean ground beef / 57 mg  1 small side salad (1 cup raw spinach leaves,  cup cucumber,  cup yellow bell pepper) with 1 tsp olive oil and 1 tsp red wine vinegar / 25 mg  Snack / Sodium (mg)  1 container low-fat vanilla yogurt / 107 mg  3  graham cracker squares / 127 mg  Nutrient Analysis  Calories: 1745  Protein: 75 g  Carbohydrate: 237 g  Fat: 57 g  Sodium: 1425 mg  Document Released: 10/28/2005 Document Revised: 07/10/2011 Document Reviewed: 01/29/2010  ExitCare Patient Information 2012 [place].))    This information is not intended to replace advice given to you by your health care provider. Make sure you discuss any questions you have with your health care provider. Document Released: 10/31/2003 Document Revised: 05/17/2016 Document Reviewed: 04/08/2016 Elsevier Interactive Patient Education  2017 [employer].

## 2018-10-07 LAB — VITAMIN B12: Vitamin B-12: 1175 pg/mL (ref 232–1245)

## 2018-10-07 LAB — MICROALBUMIN / CREATININE URINE RATIO
CREATININE, UR: 57.2 mg/dL
Microalbumin, Urine: <3 ug/mL

## 2018-10-07 LAB — MAGNESIUM: Magnesium: 2.2 mg/dL (ref 1.6–2.3)

## 2018-10-14 ENCOUNTER — Other Ambulatory Visit: Payer: Self-pay | Admitting: Family Medicine

## 2018-10-14 DIAGNOSIS — E559 Vitamin D deficiency, unspecified: Secondary | ICD-10-CM

## 2018-10-26 ENCOUNTER — Telehealth: Payer: Self-pay | Admitting: Family Medicine

## 2018-10-26 ENCOUNTER — Other Ambulatory Visit: Payer: Self-pay | Admitting: Family Medicine

## 2018-10-26 DIAGNOSIS — E559 Vitamin D deficiency, unspecified: Secondary | ICD-10-CM

## 2018-10-26 NOTE — Telephone Encounter (Signed)
Called and notified the patient to cont. Vit D weekly and refill has been sent to the pharmacy. MPulliam, CMA/RT(R)

## 2018-10-26 NOTE — Telephone Encounter (Signed)
Pt called states she doesn't know if Dr.O wants her to continue taking Vit-D but if so the pharmacy needs her to approve the refill request.  --Forwarding message to medical assistant since (2) e-script request have been sent.--glh

## 2018-11-02 ENCOUNTER — Other Ambulatory Visit: Payer: Self-pay | Admitting: Family Medicine

## 2018-11-02 DIAGNOSIS — E7849 Other hyperlipidemia: Secondary | ICD-10-CM

## 2018-11-02 DIAGNOSIS — I10 Essential (primary) hypertension: Secondary | ICD-10-CM

## 2018-11-24 ENCOUNTER — Other Ambulatory Visit: Payer: Self-pay | Admitting: Family Medicine

## 2018-11-24 DIAGNOSIS — E119 Type 2 diabetes mellitus without complications: Secondary | ICD-10-CM

## 2019-01-08 ENCOUNTER — Telehealth: Payer: Self-pay | Admitting: Family Medicine

## 2019-01-08 ENCOUNTER — Other Ambulatory Visit (HOSPITAL_COMMUNITY): Payer: Self-pay | Admitting: Family Medicine

## 2019-01-08 ENCOUNTER — Encounter: Payer: Self-pay | Admitting: Family Medicine

## 2019-01-08 ENCOUNTER — Ambulatory Visit: Payer: Medicare PPO | Admitting: Family Medicine

## 2019-01-08 ENCOUNTER — Ambulatory Visit (HOSPITAL_COMMUNITY)
Admission: RE | Admit: 2019-01-08 | Discharge: 2019-01-08 | Disposition: A | Discharge status: Home / Self Care | Payer: Medicare PPO | Source: Ambulatory Visit | Attending: Vascular Surgery | Admitting: Vascular Surgery

## 2019-01-08 VITALS — BP 138/82 | HR 94 | Temp 98.4°F | Ht 65.0 in | Wt 171.3 lb

## 2019-01-08 DIAGNOSIS — I824Y1 Acute embolism and thrombosis of unspecified deep veins of right proximal lower extremity: Secondary | ICD-10-CM

## 2019-01-08 DIAGNOSIS — I83811 Varicose veins of right lower extremities with pain: Secondary | ICD-10-CM | POA: Diagnosis not present

## 2019-01-08 DIAGNOSIS — I878 Other specified disorders of veins: Secondary | ICD-10-CM

## 2019-01-08 NOTE — Patient Instructions (Signed)
Varicose Veins Varicose veins are veins that have become enlarged, bulged, and twisted. They most often appear in the legs. What are the causes? This condition is caused by damage to the valves in the vein. These valves help blood return to your heart. When they are damaged and they stop working properly, blood may flow backward and back up in the veins near the skin, causing the veins to get larger and appear twisted. The condition can result from any issue that causes blood to back up, like pregnancy, prolonged standing, or obesity. What increases the risk? This condition is more likely to develop in people who are:  On their feet a lot.  Pregnant.  Overweight. What are the signs or symptoms? Symptoms of this condition include:  Bulging, twisted, and bluish veins.  A feeling of heaviness. This may be worse at the end of the day.  Leg pain. This may be worse at the end of the day.  Swelling in the leg.  Changes in skin color over the veins. How is this diagnosed? This condition may be diagnosed based on your symptoms, a physical exam, and an ultrasound test. How is this treated? Treatment for this condition may involve:  Avoiding sitting or standing in one position for long periods of time.  Wearing compression stockings. These stockings help to prevent blood clots and reduce swelling in the legs.  Raising (elevating) the legs when resting.  Losing weight.  Exercising regularly. If you have persistent symptoms or want to improve the way your varicose veins look, you may choose to have a procedure to close the varicose veins off or to remove them. Treatments to close off the veins include:  Sclerotherapy. In this treatment, a solution is injected into a vein to close it off.  Laser treatment. In this treatment, the vein is heated with a laser to close it off.  Radiofrequency vein ablation. In this treatment, an electrical current produced by radio waves is used to close  off the vein. Treatments to remove the veins include:  Phlebectomy. In this treatment, the veins are removed through small incisions made over the veins.  Vein ligation and stripping. In this treatment, incisions are made over the veins. The veins are then removed after being tied (ligated) with stitches (sutures). Follow these instructions at home: Activity  Walk as much as possible. Walking increases blood flow. This helps blood return to the heart and takes pressure off your veins. It also increases your cardiovascular strength.  Follow your health care provider's instructions about exercising.  Do not stand or sit in one position for a long period of time.  Do not sit with your legs crossed.  Rest with your legs raised during the day. General instructions   Follow any diet instructions given to you by your health care provider.  Wear compression stockings as directed by your health care provider. Do not wear other kinds of tight clothing around your legs, pelvis, or waist.  Elevate your legs at night to above the level of your heart.  If you get a cut in the skin over the varicose vein and the vein bleeds: ? Lie down with your leg raised. ? Apply firm pressure to the cut with a clean cloth until the bleeding stops. ? Place a bandage (dressing) on the cut. Contact a health care provider if:  The skin around your varicose veins starts to break down.  You have pain, redness, tenderness, or hard swelling over a vein.  You  are uncomfortable because of pain.  You get a cut in the skin over a varicose vein and it will not stop bleeding. Summary  Varicose veins are veins that have become enlarged, bulged, and twisted. They most often appear in the legs.  This condition is caused by damage to the valves in the vein. These valves help blood return to your heart.  Treatment for this condition includes frequent movements, wearing compression stockings, losing weight, and  exercising regularly. In some cases, procedures are done to close off or remove the veins.  Treatment for this condition may include wearing compression stockings, elevating the legs, losing weight, and engaging in regular activity. In some cases, procedures are done to close off or remove the veins. This information is not intended to replace advice given to you by your health care provider. Make sure you discuss any questions you have with your health care provider. Document Released: 08/07/2005 Document Revised: 11/20/2016 Document Reviewed: 11/20/2016 Elsevier Interactive Patient Education  2019 [employer].

## 2019-01-08 NOTE — Progress Notes (Signed)
Pt here for an acute care OV today  Impression and Recommendations:    1. Venous congestion   2. RULE OUT -   Deep vein thrombosis (DVT) of proximal vein of right lower extremity, unspecified chronicity (HCC)   3. Varicose veins of right lower extremity with pain     1. Right Lower Extremity Pain & Swelling - Venous Congestion, Varicose Veins of RLE  ACUTE Care:  - discussed risks with pt of DVT and subsequent sequela that can become of it. She understands significance of this - Need for ultrasound today to rule out DVT.  Tyler, referral clerk, stated she has appt later on today [facility] for acute R/O with LExt doppler studies.   After acute DVT is ruled out today, she will need to go to vein specialist for definitive txmnt of her painful varicose veins - Referral to vein specialist placed today.   Orders Placed This Encounter  Procedures  . US Venous Img Lower Bilateral  . Ambulatory referral to Vascular Surgery     Education and routine counseling performed. Handouts provided  Patient will call with any questions prior to using medication if they have concerns.    Expresses verbal understanding and consents to current therapy and treatment regimen.  No barriers to understanding were identified.  Red flag symptoms and signs discussed in detail.  Patient expressed understanding regarding what to do in case of emergency\urgent symptoms   Please see AVS handed out to patient at the end of our visit for further patient instructions/ counseling done pertaining to today's office visit.   Return if symptoms worsen or fail to improve, for F-up of current med issues as previously d/c pt.     Note:  This document was prepared occasionally using Dragon voice recognition software and may include unintentional dictation errors in addition to a scribe.  This document serves as a record of services personally performed by Mellody Dance, DO. It was created on her behalf by  Toni Amend, a trained medical scribe. The creation of this record is based on the scribe's personal observations and the provider's statements to them.   I have reviewed the above medical documentation for accuracy and completeness and I concur.  Mellody Dance, DO 01/08/2019 7:15 PM       ------------------------------------------------------------------------------------------------------------------------------------    Subjective:    CC:  Chief Complaint  Patient presents with  . Leg Swelling    HPI: Mackenzie Barber is a 78 y.o. female who presents to [place] at [facility] today for issues as discussed below.  Her right leg hurts.  She first noticed this around the first of the week, about 5 days ago.  Patient wants to make sure it's not a blood clot.  The area is painful, and hurts when she walks.  Notes her leg hurts up to her knee when she walks, "more in that one spot."  States by the end of the day, the entire area is swollen up.  Says "I've got bad veins, but I've never had one puff up and get sore like that."  She has talked to family doctors and other specialists in the past, and was told "if you get [your veins] stripped, they may come back."  She has never had treatment for her veins in the past.  States she's tried to remember what she was doing, whether or not she bumped her leg, and she can't remember bumping it, "but that's possible."  Patient is not on anticoagulation.  Denies shortness of breath, more so than usual.     No problems updated.   Wt Readings from Last 3 Encounters:  01/08/19 171 lb 4.8 oz (77.7 kg)  10/06/18 166 lb 6.4 oz (75.5 kg)  07/09/18 166 lb (75.3 kg)   BP Readings from Last 3 Encounters:  01/08/19 138/82  10/06/18 138/72  07/09/18 (!) 146/69   BMI Readings from Last 3 Encounters:  01/08/19 28.51 kg/m  10/06/18 27.69 kg/m  07/09/18 27.62 kg/m     Patient Care Team    Relationship  Specialty Notifications Start End  Mellody Dance, DO PCP - General Family Medicine  06/11/17      Patient Active Problem List   Diagnosis Date Noted  . Diabetes mellitus without complication ([place]) 78/24/2353    Priority: High  . Hypertension associated with diabetes ([place]) 06/11/2017    Priority: Medium  . Hyperlipidemia associated with type 2 diabetes mellitus ([place]) 06/11/2017    Priority: Medium  . Ulcerative colitis ([place]) 06/11/2017    Priority: Medium  . CLL (chronic lymphocytic leukemia) ([place]) 06/11/2017    Priority: Low  . Polyp of colon 06/11/2017    Priority: Low  . Family history of B12 deficiency 10/06/2018  . History of irregular heartbeat-  infreq episodes and asymptomatic lasting less than 1-2 minutes 05/05/2018  . Counseling on health promotion and disease prevention 11/20/2017  . Hyperkeratotic Skin lesion of right leg- changing recently 11/20/2017  . Vitamin D insufficiency 06/24/2017  . GERD (gastroesophageal reflux disease) 06/11/2017  . S/P hysterectomy- about 55yr ago 06/11/2017  . Other fatigue 06/11/2017  . Osteoporosis screening 06/11/2017      Past Medical History:  Diagnosis Date  . Diabetes mellitus without complication ([place]   . Hyperlipidemia   . Hypertension   . 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 ABDOMINAL HYSTERECTOMY    . TONSILECTOMY/ADENOIDECTOMY WITH MYRINGOTOMY       Family History  Problem Relation Age of Onset  . Cancer Mother        colon  . Hyperlipidemia Mother   . Hypertension Mother      Social History   Socioeconomic History  . Marital status: Unknown    Spouse name: Not on file  . Number of children: Not on file  . Years of education: Not on file  . Highest education level: Not on file  Occupational History  . Not on file  Social Needs  . Financial resource strain: Not on file  . Food insecurity:    Worry: Not on file    Inability: Not on file  .  Transportation needs:    Medical: Not on file    Non-medical: Not on file  Tobacco Use  . Smoking status: Never Smoker  . Smokeless tobacco: Never Used  Substance and Sexual Activity  . Alcohol use: No  . Drug use: No  . Sexual activity: Never  Lifestyle  . Physical activity:    Days per week: Not on file    Minutes per session: Not on file  . Stress: Not on file  Relationships  . Social connections:    Talks on phone: Not on file    Gets together: Not on file    Attends religious service: Not on file    Active member of club or organization: Not on file    Attends meetings of clubs or organizations: Not on file  Relationship status: Not on file  . Intimate partner violence:    Fear of current or ex partner: Not on file    Emotionally abused: Not on file    Physically abused: Not on file    Forced sexual activity: Not on file  Other Topics Concern  . Not on file  Social History Narrative  . Not on file     Current Meds  Medication Sig  . Alcohol Swabs (B-D SINGLE USE SWABS REGULAR) PADS 1 Package by Does not apply route 2 (two) times daily.  Marland Kitchen atorvastatin (LIPITOR) 10 MG tablet TAKE 1 TABLET EVERY DAY  . Blood Glucose Calibration (ACCU-CHEK AVIVA) SOLN 1 drop by In Vitro route 2 (two) times daily.  . Calcium Carbonate-Vitamin D 600-400 MG-UNIT tablet Take 1 tablet by mouth 2 (two) times daily. ( take 1200 calcium per day)  . Cholecalciferol (VITAMIN D3) 5000 units TABS 5,000 IU OTC vitamin D3 daily.  . cyanocobalamin 1000 MCG tablet Take 1,000 mcg by mouth daily.  Marland Kitchen glucose blood (ACCU-CHEK AVIVA) test strip Use as instructed  . glycopyrrolate (ROBINUL-FORTE) 2 MG tablet Take 1 tab by mouth twice daily for cramping and spasms.  . hydrochlorothiazide (HYDRODIURIL) 25 MG tablet TAKE 1 TABLET EVERY DAY  . Lancet Device MISC 1 Device by Does not apply route 2 (two) times daily.  Marland Kitchen losartan (COZAAR) 100 MG tablet TAKE 1 TABLET EVERY DAY  . mesalamine (APRISO) 0.375 g 24  hr capsule TAKE 4 CAPSULES EVERY DAY  . metFORMIN (GLUCOPHAGE) 500 MG tablet TAKE 1/2 TABLET TWICE DAILY WITH A MEAL  . Vitamin D, Ergocalciferol, (DRISDOL) 1.25 MG (50000 UT) CAPS capsule TAKE 1 CAPSULE BY MOUTH EVERY 7 DAYS.    Allergies:  No Known Allergies   Review of Systems: General:   Denies fever, chills, unexplained weight loss.  Optho/Auditory:   Denies visual changes, blurred vision/LOV Respiratory:   Denies wheeze, DOE more than baseline levels.   Cardiovascular:   Denies chest pain, palpitations, new onset peripheral edema  Gastrointestinal:   Denies nausea, vomiting, diarrhea, abd pain.  Genitourinary: Denies dysuria, freq/ urgency, flank pain or discharge from genitals.  Endocrine:     Denies hot or cold intolerance, polyuria, polydipsia. Musculoskeletal:   Denies unexplained myalgias, joint swelling, unexplained arthralgias, gait problems.  Skin:  Denies new onset rash, suspicious lesions Neurological:     Denies dizziness, unexplained weakness, numbness  Psychiatric/Behavioral:   Denies mood changes, suicidal or homicidal ideations, hallucinations    Objective:   Blood pressure 138/82, pulse 94, temperature 98.4 F (36.9 C), height 5' 5"  (1.651 m), weight 171 lb 4.8 oz (77.7 kg), SpO2 96 %. Body mass index is 28.51 kg/m. General:  Well Developed, well nourished, appropriate for stated age.  Neuro:  Alert and oriented,  extra-ocular muscles intact  HEENT:  Normocephalic, atraumatic, neck supple Skin:  no gross rash, warm, pink. Cardiac:  RRR, S1 S2 Respiratory:  ECTA B/L and A/P, Not using accessory muscles, speaking in full sentences- unlabored. Vascular:  Ext warm, no cyanosis apprec.; cap RF less 2 sec. Psych:  No HI/SI, judgement and insight good, Euthymic mood. Full Affect. Right Lower Extremity:  Right lower extremity with pitting edema up to the knee.  Several enlarged varicose veins present and mild swelling of the right entire lower extremity vs the left.   About a 4 cm diameter ecchymotic and indurated skin lesion in the proximal medial aspect of her tibia.

## 2019-01-08 NOTE — Telephone Encounter (Signed)
On-call physician report: Received preliminary lower venous study on Mackenzie Barber just before 5 PM.  It showed findings consistent with acute superficial vein thrombosis involving the right great saphenous vein in the right viscosities.  There was no sign of DVT.  Based on current recommendations recommend compression with an Ace wrap or compression stockings.  Patient had reported that compression stockings had been uncomfortable when she has tried them in the past so recommended an Ace wrap over the affected area in addition to an NSAID.  Will treat with ibuprofen 400 mg every 6 hours.  Follow-up with PCP in 1 week to recheck area.  Was contacted by [name] and spoke with her around 5 PM along with her daughter [name].

## 2019-01-13 ENCOUNTER — Ambulatory Visit: Payer: Medicare PPO | Admitting: Gastroenterology

## 2019-01-27 ENCOUNTER — Telehealth: Payer: Self-pay

## 2019-01-27 NOTE — Telephone Encounter (Signed)
Covid-19 travel screening questions  Have you traveled in the last 14 days?No If yes where?  Do you now or have you had a fever in the last 14 days? No Do you have any respiratory symptoms of shortness of breath or cough now or in the last 14 days? No Do you have a medical history of Congestive Heart Failure? n/a  Do you have a medical history of lung disease? n/a  Do you have any family members or close contacts with diagnosed or suspected Covid-19?  No

## 2019-01-28 ENCOUNTER — Encounter: Payer: Self-pay | Admitting: Physician Assistant

## 2019-01-28 ENCOUNTER — Other Ambulatory Visit: Payer: Self-pay

## 2019-01-28 ENCOUNTER — Ambulatory Visit: Payer: Medicare PPO | Admitting: Physician Assistant

## 2019-01-28 ENCOUNTER — Other Ambulatory Visit (INDEPENDENT_AMBULATORY_CARE_PROVIDER_SITE_OTHER): Payer: Medicare PPO

## 2019-01-28 VITALS — BP 122/64 | HR 82 | Ht 65.5 in | Wt 168.4 lb

## 2019-01-28 DIAGNOSIS — R109 Unspecified abdominal pain: Secondary | ICD-10-CM

## 2019-01-28 DIAGNOSIS — K51818 Other ulcerative colitis with other complication: Secondary | ICD-10-CM

## 2019-01-28 DIAGNOSIS — R197 Diarrhea, unspecified: Secondary | ICD-10-CM

## 2019-01-28 DIAGNOSIS — K625 Hemorrhage of anus and rectum: Secondary | ICD-10-CM

## 2019-01-28 LAB — BASIC METABOLIC PANEL
BUN: 9 mg/dL (ref 6–23)
CALCIUM: 9.9 mg/dL (ref 8.4–10.5)
CHLORIDE: 98 meq/L (ref 96–112)
CO2: 28 mEq/L (ref 19–32)
CREATININE: 0.75 mg/dL (ref 0.40–1.20)
GFR: 75.13 mL/min (ref >60.00)
Glucose, Bld: 95 mg/dL (ref 70–99)
Potassium: 4.2 mEq/L (ref 3.5–5.1)
Sodium: 134 mEq/L — ABNORMAL LOW (ref 135–145)

## 2019-01-28 LAB — CBC WITH DIFFERENTIAL/PLATELET
BASOS ABS: 0.2 10*3/uL — AB (ref 0.0–0.1)
Basophils Relative: 0.8 % (ref 0.0–3.0)
Eosinophils Absolute: 0.2 10*3/uL (ref 0.0–0.7)
Eosinophils Relative: 0.7 % (ref 0.0–5.0)
HCT: 40.2 % (ref 36.0–46.0)
Hemoglobin: 13.5 g/dL (ref 12.0–15.0)
Lymphocytes Relative: 74.9 % — ABNORMAL HIGH (ref 12.0–46.0)
Lymphs Abs: 20.7 10*3/uL — ABNORMAL HIGH (ref 0.7–4.0)
MCHC: 33.6 g/dL (ref 30.0–36.0)
MCV: 89.6 fl (ref 78.0–100.0)
Monocytes Absolute: 0.7 10*3/uL (ref 0.1–1.0)
Monocytes Relative: 2.6 % — ABNORMAL LOW (ref 3.0–12.0)
Neutro Abs: 5.8 10*3/uL (ref 1.4–7.7)
Neutrophils Relative %: 21 % — ABNORMAL LOW (ref 43.0–77.0)
Platelets: 257 10*3/uL (ref 150.0–400.0)
RBC: 4.48 Mil/uL (ref 3.87–5.11)
RDW: 13.4 % (ref 11.5–15.5)
WBC: 27.6 10*3/uL (ref 4.0–10.5)

## 2019-01-28 LAB — C-REACTIVE PROTEIN: CRP: <1 mg/dL (ref 0.5–20.0)

## 2019-01-28 LAB — SEDIMENTATION RATE: Sed Rate: 21 mm/hr (ref 0–30)

## 2019-01-28 MED ORDER — MESALAMINE 1.2 G PO TBEC: 4.8000 g | DELAYED_RELEASE_TABLET | Freq: Every day | ORAL | 3 refills | Status: DC

## 2019-01-28 MED ORDER — NA SULFATE-K SULFATE-MG SULF 17.5-3.13-1.6 GM/177ML PO SOLN: 1.0000 | Freq: Once | ORAL | 0 refills | Status: AC

## 2019-01-28 MED ORDER — PREDNISONE 10 MG PO TABS: 10.0000 mg | ORAL_TABLET | Freq: Every day | ORAL | 0 refills | Status: DC

## 2019-01-28 NOTE — Progress Notes (Signed)
Subjective:    Patient ID: Mackenzie Barber, female    DOB: January 12, 1941, 78 y.o.   MRN: [id number]  HPI Mackenzie Barber is a pleasant 78 year old white female, known to Dr. Silverio Decamp who was last seen in our office about a year ago by myself. She has history of left-sided ulcerative colitis diagnosed about 20 years ago, CLL was diagnosed in 2013, she is on observation therapy.  Also with history of colon polyps, hypertension, adult onset diabetes mellitus and GERD. Her last colonoscopy was done in March 2016 in [place].  She had one 10 mm pedunculated polyp removed which by biopsy was an inflammatory polyp.  She also was noted to have active left-sided ulcerative colitis.  Biopsies from the transverse colon and a sending colon showed no active inflammation and biopsies from the left colon showed active ulcerative colitis. She has been on a Apriso 1.5 g daily for maintenance therapy.  She comes in today with concerns about exacerbation of her colitis symptoms over the past 2 months.  She says she is really never gotten to the point where she does not ever see any blood in her stool.  She says since about the beginning of December however she has developed more frequent bowel movements, generally having 2-3 formed to semi-formed bowel movements in the morning and then multiple episodes of "squirts" later in the day.  She says frequently she have to get up at night for  very small volume bowel movements as well.  She is seeing blood with most of her bowel movements over the past couple of months months generally red and she says sometimes mixed in and most of the time seems to be after the bowel movement.  She does have urgency and abdominal cramping.  She has not had any fever, occasionally has chills which she says is chronic.  We had given her a prescription for Robinul Forte but she did not find that helpful and has been using Tylenol for cramping which she does find helpful. Appetite has been  okay, weight has been stable. She has not started on any new medications, and has not had any antibiotics to her knowledge over the past several months. She also tells me today that her insurance is no longer going to cover Apriso, but will cover balsalazide, mesalamine and Lialda.  Review of Systems Pertinent positive and negative review of systems were noted in the above HPI section.  All other review of systems was otherwise negative.  Outpatient Encounter Medications as of 01/28/2019  Medication Sig  . Alcohol Swabs (B-D SINGLE USE SWABS REGULAR) PADS 1 Package by Does not apply route 2 (two) times daily.  Marland Kitchen atorvastatin (LIPITOR) 10 MG tablet TAKE 1 TABLET EVERY DAY  . Blood Glucose Calibration (ACCU-CHEK AVIVA) SOLN 1 drop by In Vitro route 2 (two) times daily.  . Calcium Carbonate-Vitamin D 600-400 MG-UNIT tablet Take 1 tablet by mouth 2 (two) times daily. ( take 1200 calcium per day)  . Cholecalciferol (VITAMIN D3) 5000 units TABS 5,000 IU OTC vitamin D3 daily.  . cyanocobalamin 1000 MCG tablet Take 1,000 mcg by mouth daily.  . hydrochlorothiazide (HYDRODIURIL) 25 MG tablet TAKE 1 TABLET EVERY DAY  . losartan (COZAAR) 100 MG tablet TAKE 1 TABLET EVERY DAY  . metFORMIN (GLUCOPHAGE) 500 MG tablet TAKE 1/2 TABLET TWICE DAILY WITH A MEAL  . Vitamin D, Ergocalciferol, (DRISDOL) 1.25 MG (50000 UT) CAPS capsule TAKE 1 CAPSULE BY MOUTH EVERY 7 DAYS.  . [DISCONTINUED] mesalamine (  APRISO) 0.375 g 24 hr capsule TAKE 4 CAPSULES EVERY DAY  . mesalamine (LIALDA) 1.2 g EC tablet Take 4 tablets (4.8 g total) by mouth daily with breakfast.  . Na Sulfate-K Sulfate-Mg Sulf (SUPREP BOWEL PREP KIT) 17.5-3.13-1.6 GM/177ML SOLN Take 1 kit by mouth once for 1 dose.  . predniSONE (DELTASONE) 10 MG tablet Take 1 tablet (10 mg total) by mouth daily with breakfast. Take 2 by mouth every AM for 2 weeks then decrease to 1.5 by mouth every AM for 1 week then decrease to 1 by mouth every am for 1 week then decrease to  1/2 tablet by mouth for 1 week then discontinue  . [DISCONTINUED] glucose blood (ACCU-CHEK AVIVA) test strip Use as instructed  . [DISCONTINUED] glycopyrrolate (ROBINUL-FORTE) 2 MG tablet Take 1 tab by mouth twice daily for cramping and spasms.  . [DISCONTINUED] Lancet Device MISC 1 Device by Does not apply route 2 (two) times daily.   No facility-administered encounter medications on file as of 01/28/2019.    No Known Allergies Patient Active Problem List   Diagnosis Date Noted  . Family history of B12 deficiency 10/06/2018  . History of irregular heartbeat-  infreq episodes and asymptomatic lasting less than 1-2 minutes 05/05/2018  . Counseling on health promotion and disease prevention 11/20/2017  . Hyperkeratotic Skin lesion of right leg- changing recently 11/20/2017  . Vitamin D insufficiency 06/24/2017  . Diabetes mellitus without complication ([place]) 97/94/8016  . Hypertension associated with diabetes ([place]) 06/11/2017  . Hyperlipidemia associated with type 2 diabetes mellitus ([place]) 06/11/2017  . CLL (chronic lymphocytic leukemia) ([place]) 06/11/2017  . Ulcerative colitis ([place]) 06/11/2017  . Polyp of colon 06/11/2017  . GERD (gastroesophageal reflux disease) 06/11/2017  . S/P hysterectomy- about 38yr ago 06/11/2017  . Other fatigue 06/11/2017  . Osteoporosis screening 06/11/2017   Social History   Socioeconomic History  . Marital status: Unknown    Spouse name: Not on file  . Number of children: Not on file  . Years of education: Not on file  . Highest education level: Not on file  Occupational History  . Not on file  Social Needs  . Financial resource strain: Not on file  . Food insecurity:    Worry: Not on file    Inability: Not on file  . Transportation needs:    Medical: Not on file    Non-medical: Not on file  Tobacco Use  . Smoking status: Never Smoker  . Smokeless tobacco: Never Used  Substance and Sexual Activity  . Alcohol use: No  . Drug use: No  . Sexual  activity: Never  Lifestyle  . Physical activity:    Days per week: Not on file    Minutes per session: Not on file  . Stress: Not on file  Relationships  . Social connections:    Talks on phone: Not on file    Gets together: Not on file    Attends religious service: Not on file    Active member of club or organization: Not on file    Attends meetings of clubs or organizations: Not on file    Relationship status: Not on file  . Intimate partner violence:    Fear of current or ex partner: Not on file    Emotionally abused: Not on file    Physically abused: Not on file    Forced sexual activity: Not on file  Other Topics Concern  . Not on file  Social History Narrative  . Not  on file    Ms. Hutzler's family history includes Cancer in her mother; Hyperlipidemia in her mother; Hypertension in her mother.      Objective:    Vitals:   01/28/19 1452  BP: 122/64  Pulse: 82    Physical Exam; well-developed elderly white female in no acute distress, very pleasant.  Height 5 foot 5, weight 168, BMI 27.5.  HEENT; nontraumatic normocephalic EOMI PERRLA sclera anicteric, oral mucosa moist, Cardiovascular; regular rate and rhythm with S1-S2 no murmur rub or gallop, Pulmonary; clear bilaterally, Abdomen; soft, nondistended, bowel sounds are active to hyperactive, no significant tenderness and no focal tenderness, no guarding or rebound no palpable mass or hepatosplenomegaly, Rectal; exam not done today, Extremities; no clubbing cyanosis or edema skin warm and dry, Neuropsych; alert and oriented, grossly nonfocal mood and affect appropriate       Assessment & Plan:   #17 78 year old white female with long history of left-sided ulcerative colitis with exacerbation of symptoms over the past 2 and half months. She is having frequent small-volume loose stools, abdominal cramping, tenesmus and hematochezia.  Symptoms  are very consistent with ulcerative colitis flare  #2 history of colon polyps  and last colonoscopy March 2016 with 1 inflammatory polyp removed. #3 CLL-never required therapy, on observation #4 GERD #5.  Adult onset diabetes mellitus #6.  Hypertension  Plan; CBC with differential, sed rate, CRP. Stop Apriso Start Lialda 1.2 g, take 4 tablets once daily,total 4.8 gm daily- prescription was sent.  You will need a long-term prescription sent to her mail away pharmacy if tolerates.  Start prednisone 20 mg p.o. every morning x2 weeks, then decrease to 15 mg p.o. every morning x1 week, then decrease to 10 mg p.o. every morning x1 week then decrease to 5 mg p.o. every morning x1 week  Patient will be scheduled for colonoscopy with Dr. Silverio Decamp next week.  Procedure was discussed in detail with patient including indications risks and benefits and she is agreeable to proceed.  We discussed potential changes in scheduling due to the Covidien 19 situation.  She would like to get the colonoscopy done as soon as possible. Continue Tylenol as needed Patient advised she may use Imodium p.o. every morning and then up to 4 daily. Further plans pending results of colonoscopy.  Amy Genia Harold PA-C 01/28/2019   Cc: Mellody Dance, DO

## 2019-01-28 NOTE — Patient Instructions (Addendum)
You have been scheduled for a colonoscopy. Please follow written instructions given to you at your visit today.  Please pick up your prep supplies at the pharmacy within the next 1-3 days. If you use inhalers (even only as needed), please bring them with you on the day of your procedure.  We will send Lialda and prednisone  to your local pharmacy    Go to the basement for labs today   If you are age 76 or older, your body mass index should be between 23-30. Your Body mass index is 27.59 kg/m. If this is out of the aforementioned range listed, please consider follow up with your Primary Care Provider.  If you are age 76 or younger, your body mass index should be between 19-25. Your Body mass index is 27.59 kg/m. If this is out of the aformentioned range listed, please consider follow up with your Primary Care Provider.    To help prevent the possible spread of infection to our patients, communities, and staff; we will be implementing the following measures:  Please only allow one visitor/family member to accompany you to any upcoming appointments with [facility]. If you have any concerns about this please contact our office to discuss prior to the appointment.

## 2019-01-29 ENCOUNTER — Telehealth: Payer: Self-pay | Admitting: Physician Assistant

## 2019-01-29 NOTE — Telephone Encounter (Signed)
Prednisone called in to [facility] per Amy's instructions. Spoke to [place]. Pharmacy will call pt when ready.  Left message for pt if she had any concerns to call us back.

## 2019-01-29 NOTE — Telephone Encounter (Signed)
States that pharmacy did not receive prescription for prednisone.

## 2019-02-01 ENCOUNTER — Telehealth: Payer: Self-pay | Admitting: *Deleted

## 2019-02-01 ENCOUNTER — Other Ambulatory Visit: Payer: Medicare PPO

## 2019-02-01 DIAGNOSIS — R197 Diarrhea, unspecified: Secondary | ICD-10-CM

## 2019-02-01 DIAGNOSIS — K51818 Other ulcerative colitis with other complication: Secondary | ICD-10-CM | POA: Diagnosis not present

## 2019-02-01 NOTE — Telephone Encounter (Signed)
Ok thanks 

## 2019-02-01 NOTE — Telephone Encounter (Signed)
Dr Silverio Decamp patient is still having symptoms and wants to leave her procedure as scheduled  She will come in to the lab today to pick up GI path kit

## 2019-02-02 LAB — GASTROINTESTINAL PATHOGEN PANEL PCR
C. difficile Tox A/B, PCR: NOT DETECTED
Campylobacter, PCR: NOT DETECTED
Cryptosporidium, PCR: NOT DETECTED
E coli (ETEC) LT/ST PCR: NOT DETECTED
E coli (STEC) stx1/stx2, PCR: NOT DETECTED
E coli 0157, PCR: NOT DETECTED
Giardia lamblia, PCR: NOT DETECTED
Norovirus, PCR: NOT DETECTED
Rotavirus A, PCR: NOT DETECTED
SALMONELLA, PCR: NOT DETECTED
Shigella, PCR: NOT DETECTED

## 2019-02-02 NOTE — Progress Notes (Signed)
Will need to exclude C.diff, infectious etiology for recent exacerbation of UC symptoms prior to proceeding with colonoscopy.  Reviewed and agree with documentation and assessment and plan. Damaris Hippo , MD

## 2019-02-04 ENCOUNTER — Other Ambulatory Visit: Payer: Medicare PPO

## 2019-02-04 ENCOUNTER — Ambulatory Visit: Payer: Medicare PPO | Admitting: Hematology & Oncology

## 2019-02-04 ENCOUNTER — Ambulatory Visit: Payer: Medicare PPO | Admitting: Family Medicine

## 2019-02-04 ENCOUNTER — Telehealth: Payer: Self-pay

## 2019-02-04 NOTE — Telephone Encounter (Signed)
Covid-19 travel screening questions  Have you traveled in the last 14 days? If yes where? No.   Do you now or have you had a fever in the last 14 days? No.  Do you have any respiratory symptoms of shortness of breath or cough now or in the last 14 days? No.  Do you have a medical history of Congestive Heart Failure?  Do you have a medical history of lung disease?  Do you have any family members or close contacts with diagnosed or suspected Covid-19? No.   Called patient and spoke with her about our "NO Care partner in the lobby" policy and went over our screening questions. She agrees and understands and had no further questions.

## 2019-02-05 ENCOUNTER — Other Ambulatory Visit: Payer: Self-pay

## 2019-02-05 ENCOUNTER — Encounter: Payer: Self-pay | Admitting: Gastroenterology

## 2019-02-05 ENCOUNTER — Other Ambulatory Visit (INDEPENDENT_AMBULATORY_CARE_PROVIDER_SITE_OTHER): Payer: Medicare PPO

## 2019-02-05 ENCOUNTER — Telehealth: Payer: Self-pay | Admitting: Gastroenterology

## 2019-02-05 ENCOUNTER — Ambulatory Visit (AMBULATORY_SURGERY_CENTER): Payer: Medicare PPO | Admitting: Gastroenterology

## 2019-02-05 VITALS — BP 154/72 | HR 57 | Temp 98.5°F | Resp 17 | Ht 65.5 in | Wt 168.6 lb

## 2019-02-05 DIAGNOSIS — D128 Benign neoplasm of rectum: Secondary | ICD-10-CM | POA: Diagnosis not present

## 2019-02-05 DIAGNOSIS — K921 Melena: Secondary | ICD-10-CM | POA: Diagnosis not present

## 2019-02-05 DIAGNOSIS — K51818 Other ulcerative colitis with other complication: Secondary | ICD-10-CM

## 2019-02-05 DIAGNOSIS — K621 Rectal polyp: Secondary | ICD-10-CM | POA: Diagnosis not present

## 2019-02-05 DIAGNOSIS — K5150 Left sided colitis without complications: Secondary | ICD-10-CM | POA: Diagnosis not present

## 2019-02-05 DIAGNOSIS — K529 Noninfective gastroenteritis and colitis, unspecified: Secondary | ICD-10-CM | POA: Diagnosis not present

## 2019-02-05 DIAGNOSIS — Z1211 Encounter for screening for malignant neoplasm of colon: Secondary | ICD-10-CM | POA: Diagnosis not present

## 2019-02-05 LAB — IBC PANEL
Iron: 82 ug/dL (ref 42–145)
Saturation Ratios: 22.4 % (ref 20.0–50.0)
TRANSFERRIN: 261 mg/dL (ref 212.0–360.0)

## 2019-02-05 LAB — FERRITIN: Ferritin: 34.4 ng/mL (ref 10.0–291.0)

## 2019-02-05 MED ORDER — PREDNISONE 10 MG PO TABS: 40.0000 mg | ORAL_TABLET | Freq: Every day | ORAL | 0 refills | Status: AC

## 2019-02-05 MED ORDER — SODIUM CHLORIDE 0.9 % IV SOLN: 500.0000 mL | Freq: Once | INTRAVENOUS | Status: DC

## 2019-02-05 NOTE — Patient Instructions (Addendum)
Handouts given for Polyps, Ulcerative Colitis and hemorrhoids.   PICK UP NEW RX FOR INCREASED DOSE OF PREDNISONE  For 14 days.  Labs today after discharge from [place]:  CBC, Iron, TIBC and Ferritin.  YOU HAD AN ENDOSCOPIC PROCEDURE TODAY AT [facility]:   Refer to the procedure report that was given to you for any specific questions about what was found during the examination.  If the procedure report does not answer your questions, please call your gastroenterologist to clarify.  If you requested that your care partner not be given the details of your procedure findings, then the procedure report has been included in a sealed envelope for you to review at your convenience later.  YOU SHOULD EXPECT: Some feelings of bloating in the abdomen. Passage of more gas than usual.  Walking can help get rid of the air that was put into your GI tract during the procedure and reduce the bloating. If you had a lower endoscopy (such as a colonoscopy or flexible sigmoidoscopy) you may notice spotting of blood in your stool or on the toilet paper. If you underwent a bowel prep for your procedure, you may not have a normal bowel movement for a few days.  Please Note:  You might notice some irritation and congestion in your nose or some drainage.  This is from the oxygen used during your procedure.  There is no need for concern and it should clear up in a day or so.  SYMPTOMS TO REPORT IMMEDIATELY:   Following lower endoscopy (colonoscopy or flexible sigmoidoscopy):  Excessive amounts of blood in the stool (there may be small amount of bleeding due to the polyp removal and clip placed in rectum)  Significant tenderness or worsening of abdominal pains  Swelling of the abdomen that is new, acute  Fever of 100F or higher  For urgent or emergent issues, a gastroenterologist can be reached at any hour by calling [phone].   DIET:  We do recommend a small meal at first, but then you  may proceed to your regular diet.  Drink plenty of fluids but you should avoid alcoholic beverages for 24 hours.  ACTIVITY:  You should plan to take it easy for the rest of today and you should NOT DRIVE or use heavy machinery until tomorrow (because of the sedation medicines used during the test).    FOLLOW UP: Our staff will call the number listed on your records the next business day following your procedure to check on you and address any questions or concerns that you may have regarding the information given to you following your procedure. If we do not reach you, we will leave a message.  However, if you are feeling well and you are not experiencing any problems, there is no need to return our call.  We will assume that you have returned to your regular daily activities without incident.  If any biopsies were taken you will be contacted by phone or by letter within the next 1-3 weeks.  Please call us at [phone] if you have not heard about the biopsies in 3 weeks.    SIGNATURES/CONFIDENTIALITY: You and/or your care partner have signed paperwork which will be entered into your electronic medical record.  These signatures attest to the fact that that the information above on your After Visit Summary has been reviewed and is understood.  Full responsibility of the confidentiality of this discharge information lies with you and/or your care-partner.

## 2019-02-05 NOTE — Progress Notes (Signed)
History reviewed today

## 2019-02-05 NOTE — Progress Notes (Signed)
Called to room to assist during endoscopic procedure.  Patient ID and intended procedure confirmed with present staff. Received instructions for my participation in the procedure from the performing physician.  

## 2019-02-05 NOTE — Progress Notes (Signed)
To PACU, VSS. Report to RN.tb 

## 2019-02-05 NOTE — Progress Notes (Signed)
Agree, will bring her in for office or virtual visit in 1-2 weeks to see how she is doing.  Inflammatory appearing /?hamartomatous polyp,await biopsy results, if no further hematochezia can plan to schedule flex sig in 6-8 weeks for polypectomy at [facility] endoscopy. Thanks [employer]

## 2019-02-05 NOTE — Telephone Encounter (Signed)
Called and spoke with lab since not all labs looked like they were completed. Had to add-on the Iron indices. Also unable to add-on CBC. Called patient to let her know of issue. She will come back in next week to have CBC drawn on Mon-Wed. Ferritin in normal range currently, so hopeful that CBC hasn't drifted much further. Patient aware and thankful for call and will come in next week.  Justice Britain, MD [facility] Advanced Endoscopy Office # [phone]

## 2019-02-05 NOTE — Op Note (Signed)
[place] Patient Name: Mackenzie Barber Procedure Date: 02/05/2019 9:05 AM MRN: [id number] Endoscopist: Justice Britain , MD Age: 78 Referring MD:  Date of Birth: 1941/08/19 Gender: Female Account #: [id number] Procedure:                Colonoscopy Indications:              Hematochezia, Diarrhea Medicines:                Monitored Anesthesia Care Procedure:                Pre-Anesthesia Assessment:                           - Prior to the procedure, a History and Physical                            was performed, and patient medications and                            allergies were reviewed. The patient's tolerance of                            previous anesthesia was also reviewed. The risks                            and benefits of the procedure and the sedation                            options and risks were discussed with the patient.                            All questions were answered, and informed consent                            was obtained. Prior Anticoagulants: The patient has                            taken no previous anticoagulant or antiplatelet                            agents. ASA Grade Assessment: III - A patient with                            severe systemic disease. After reviewing the risks                            and benefits, the patient was deemed in                            satisfactory condition to undergo the procedure.                           After obtaining informed consent, the colonoscope  was passed under direct vision. Throughout the                            procedure, the patient's blood pressure, pulse, and                            oxygen saturations were monitored continuously. The                            Model PCF-[id number]) scope was introduced                            through the anus and advanced to the 10 cm into the                            ileum. The colonoscopy was  performed without                            difficulty. The patient tolerated the procedure.                            The quality of the bowel preparation was good. The                            terminal ileum, ileocecal valve, appendiceal                            orifice, and rectum were photographed. Scope In: 9:16:36 AM Scope Out: 9:43:57 AM Scope Withdrawal Time: 0 hours 23 minutes 29 seconds  Total Procedure Duration: 0 hours 27 minutes 21 seconds  Findings:                 The digital rectal exam findings include palpable                            rectal mass and non-thrombosed external hemorrhoids.                           The terminal ileum and ileocecal valve appeared                            normal. Biopsies were taken with a cold forceps for                            histology.                           Normal mucosa was found in the transverse colon, at                            the hepatic flexure, in the ascending colon and in                            the cecum. Four biopsies were taken every 10  cm                            with a cold forceps for ulcerative colitis                            surveillance. These biopsy specimens were placed in                            the right colon jar and were sent to Pathology.                           Inflammation characterized by altered vascularity,                            friability and granularity was found in a                            continuous and circumferential pattern from the                            rectum to the distal descending colon. No sites                            were spared. This was graded as Mayo Score 2                            (moderate, with marked erythema, absent vascular                            pattern, friability, erosions). Four biopsies were                            taken every 10 cm with a cold forceps for                            ulcerative colitis surveillance.  These biopsy                            specimens were placed in the left colon jar and                            were sent to Pathology.                           Inflammation characterized by altered vascularity,                            erythema, friability and granularity was found in a                            continuous and circumferential pattern from the  anus to the rectum. This was graded as Mayo Score 2                            (moderate, with marked erythema, absent vascular                            pattern, friability, erosions). Biopsies were taken                            with a cold forceps for histology.                           Multiple sessile and semi-sessile and                            semi-pedunculated polyps, some with erosions or                            active oozing were found in the rectum and                            recto-sigmoid colon. The polyps were 5 to 24 mm in                            size. One of the polyps that was oozing was removed                            with a hot snare. Resection and retrieval were                            complete. Significant oozing was noted after the                            procedure. Coagulation for hemostasis using snare                            tip cautery to the region was successful. To                            prevent bleeding further after the polypectomy, one                            hemostatic clip was successfully placed (MR                            conditional) at the region. There was no bleeding                            at the end of the procedure.                           Non-bleeding non-thrombosed external and internal  hemorrhoids were found during retroflexion, during                            perianal exam and during digital exam. The                            hemorrhoids were Grade I (internal hemorrhoids that                             do not prolapse). Complications:            No immediate complications. Estimated Blood Loss:     Estimated blood loss was minimal. Impression:               - Palpable rectal mass and non-thrombosed external                            hemorrhoids found on digital rectal exam.                           - The examined portion of the ileum was normal.                            Biopsied.                           - Normal mucosa in the transverse colon, at the                            hepatic flexure, in the ascending colon and in the                            cecum. Biopsied.                           - Ulcerative colitis. Inflammation was found from                            the rectum to the descending colon. This was graded                            as Mayo Score 2 (moderate disease). Biopsied.                           - Ulcerative colitis. Inflammation was found from                            the anus to the rectum. This was graded as Mayo                            Score 2 (moderate disease). Biopsied.                           - Multiple 5 to 24 mm polyps in the rectum and at  the recto-sigmoid colon, one was removed with a hot                            snare. Resected and retrieved. Treated with a hot                            snare tip for oozing and clip (MR conditional) was                            placed.                           - Non-bleeding non-thrombosed external and internal                            hemorrhoids. Recommendation:           - The patient will be observed post-procedure,                            until all discharge criteria are met.                           - Discharge patient to home.                           - Patient has a contact number available for                            emergencies. The signs and symptoms of potential                            delayed complications were discussed with  the                            patient. Return to normal activities tomorrow.                            Written discharge instructions were provided to the                            patient.                           - Resume previous diet.                           - Would increase Prednisone up to 40 mg daily.                            Continue 40 mg daily for next 2-weeks, then will                            try to decrease dosing after biopsies return and  decision made about potential repeat Colonoscopy                            attempt for polyp resection if she has continued                            hematochezia +/- Iron deficiency development. Will                            discuss with Dr. Silverio Decamp.                           - Await pathology results.                           - Repeat colonoscopy at appointment to be scheduled                            to consider resection of likely inflammatory polyps.                           - The findings and recommendations were discussed                            with the patient. Justice Britain, MD 02/05/2019 10:03:17 AM

## 2019-02-08 ENCOUNTER — Telehealth: Payer: Self-pay

## 2019-02-08 ENCOUNTER — Telehealth: Payer: Self-pay | Admitting: Gastroenterology

## 2019-02-08 ENCOUNTER — Telehealth: Payer: Self-pay | Admitting: *Deleted

## 2019-02-08 NOTE — Progress Notes (Signed)
Request to schedule her an appointment routed to the [facility] staff.

## 2019-02-08 NOTE — Telephone Encounter (Signed)
  Follow up Call-  Call back number 02/05/2019  Post procedure Call Back phone  # [phone]  Permission to leave phone message Yes  Some recent data might be hidden    [facility]

## 2019-02-08 NOTE — Telephone Encounter (Signed)
Iron studies within normal range.  Hemoglobin 13.5 on January 28, 2019.  She can have hemoglobin check through her PMD Dr. Hershal Coria office if continuing to have significant bleeding but if only small volume, spotting when she wipes, can hold off repeat labs for now.

## 2019-02-08 NOTE — Telephone Encounter (Signed)
LVM to schedule Webex visit in the next 1 or 2 weeks.

## 2019-02-08 NOTE — Telephone Encounter (Signed)
  Follow up Call-  Call back number 02/05/2019  Post procedure Call Back phone  # [phone]  Permission to leave phone message Yes  Some recent data might be hidden    [facility]

## 2019-02-08 NOTE — Telephone Encounter (Signed)
Pt is having just a "little bit of spotting on the toilet paper; the same amount as before her procedure."  No clots or large amount of blood passed.  Dr. Silverio Decamp,  She is to have a CBC at the beginning of this week.  Would she be able to do that at her family doctor's office, Dr. Raliegh Scarlet?  This is very close to where she [place].  Thanks, [employer]

## 2019-02-08 NOTE — Telephone Encounter (Signed)
Spoke with pt and told her Dr. Woodward Ku recommendations at this time.  She will call back if she has significant bleeding.

## 2019-02-09 ENCOUNTER — Telehealth: Payer: Self-pay | Admitting: Family Medicine

## 2019-02-09 ENCOUNTER — Other Ambulatory Visit: Payer: Self-pay

## 2019-02-09 ENCOUNTER — Encounter: Payer: Self-pay | Admitting: Gastroenterology

## 2019-02-09 DIAGNOSIS — I10 Essential (primary) hypertension: Secondary | ICD-10-CM

## 2019-02-09 MED ORDER — HYDROCHLOROTHIAZIDE 25 MG PO TABS: 25.0000 mg | ORAL_TABLET | Freq: Every day | ORAL | 0 refills | Status: DC

## 2019-02-09 NOTE — Telephone Encounter (Signed)
Patient called request refill on HCTZ sent to [place].  Reviewed chart and sent in refill per office policy.  Patient notified. MPulliam, CMA/RT(R)

## 2019-02-09 NOTE — Telephone Encounter (Signed)
Patient is requesting a refill of her hydrochlorothiazide, she is completely out of this med and is requesting to have the refill sent to [facility] (NOT MAIL ORDER).

## 2019-02-09 NOTE — Telephone Encounter (Signed)
Refill sent in and patient notified. MPulliam, CMA/RT(R)

## 2019-02-10 NOTE — Telephone Encounter (Signed)
Not interested in doing Webex at this time. Will call back after pandemic threat.

## 2019-02-11 ENCOUNTER — Other Ambulatory Visit: Payer: Self-pay

## 2019-02-11 ENCOUNTER — Ambulatory Visit (INDEPENDENT_AMBULATORY_CARE_PROVIDER_SITE_OTHER): Payer: Medicare PPO | Admitting: Family Medicine

## 2019-02-11 ENCOUNTER — Encounter: Payer: Self-pay | Admitting: Family Medicine

## 2019-02-11 VITALS — Ht 65.5 in | Wt 168.0 lb

## 2019-02-11 DIAGNOSIS — E538 Deficiency of other specified B group vitamins: Secondary | ICD-10-CM | POA: Insufficient documentation

## 2019-02-11 DIAGNOSIS — I152 Hypertension secondary to endocrine disorders: Secondary | ICD-10-CM

## 2019-02-11 DIAGNOSIS — E1169 Type 2 diabetes mellitus with other specified complication: Secondary | ICD-10-CM

## 2019-02-11 DIAGNOSIS — I10 Essential (primary) hypertension: Secondary | ICD-10-CM | POA: Diagnosis not present

## 2019-02-11 DIAGNOSIS — E1159 Type 2 diabetes mellitus with other circulatory complications: Secondary | ICD-10-CM | POA: Diagnosis not present

## 2019-02-11 DIAGNOSIS — E119 Type 2 diabetes mellitus without complications: Secondary | ICD-10-CM

## 2019-02-11 DIAGNOSIS — E785 Hyperlipidemia, unspecified: Secondary | ICD-10-CM | POA: Diagnosis not present

## 2019-02-11 NOTE — Progress Notes (Signed)
Virtual Visit via Telephone Note for [employer], D.O- Primary Care Physician at [facility]   I connected with current patient today by telephone and verified that I am speaking with the correct person using two identifiers.   Because of federal recommendations of social distancing due to the current novel COVID-19 outbreak, an audio/video telehealth visit is felt to be most appropriate for this patient at this time.   My staff members also discussed with the patient that there may be a patient charge related to this service.   The patient expressed understanding, and agreed to proceed.    History of Present Illness:  HTN:  - not checking them at all but she did order BP cuff. No sx at all though, ordering one to check at home   UC: - recently colonoscopy - Dr. Ronda Fairly and [place] -Reviewed briefly with patient that no acute malignancy was found other than what was already discussed with her Septra.  I also told patient there is a letter in the mail from Dr. Henreitta Cea Roddy talking about the details. -Patient will follow-up with that for any gastroenterology group problems.  Apparently patient will follow-up with Dr. Rush Landmark.    -They have placed patient on prednisone pack for the inflammation in her colon.  Patient denies any side effect except for a little bit shaky the first day or so has not been checking sugars but no complaints  DM HPI: -  She has not been working on diet and exercise for diabetes Pt is currently maintained on the following medications for diabetes:   see med list today Medication compliance - good Home glucose readings range-  Not checking  Denies polyuria/polydipsia. Denies hypo/ hyperglycemia symptoms  Last A1C in the office was:  Lab Results  Component Value Date   HGBA1C 6.2 (A) 10/06/2018   HGBA1C 6.2 05/05/2018   HGBA1C 6.1 (H) 11/20/2017    Lab Results  Component Value Date   LDLCALC 63 05/05/2018   CREATININE 0.75  01/28/2019    Wt Readings from Last 3 Encounters:  02/11/19 168 lb (76.2 kg)  02/05/19 168 lb 9.6 oz (76.5 kg)  01/28/19 168 lb 6 oz (76.4 kg)    BP Readings from Last 3 Encounters:  02/05/19 (!) 154/72  01/28/19 122/64  01/08/19 138/82       Wt Readings from Last 3 Encounters:  02/11/19 168 lb (76.2 kg)  02/05/19 168 lb 9.6 oz (76.5 kg)  01/28/19 168 lb 6 oz (76.4 kg)    BP Readings from Last 3 Encounters:  02/05/19 (!) 154/72  01/28/19 122/64  01/08/19 138/82    Pulse Readings from Last 3 Encounters:  02/05/19 (!) 57  01/28/19 82  01/08/19 94    BMI Readings from Last 3 Encounters:  02/11/19 27.53 kg/m  02/05/19 27.63 kg/m  01/28/19 27.59 kg/m      -Vitals obtained; Medications, allergies reconciled;  personal medical, social, Sx etc. etc. histories were updated by Lanier Prude the medical assistant today and are reflected in below chart  Patient Care Team    Relationship Specialty Notifications Start End  Mellody Dance, DO PCP - General Family Medicine  06/11/17      Patient Active Problem List   Diagnosis Date Noted  . Diabetes mellitus without complication ([place]) 16/08/9603    Priority: High  . Hypertension associated with diabetes ([place]) 06/11/2017    Priority: Medium  . Hyperlipidemia associated with type 2 diabetes mellitus ([place]) 06/11/2017  Priority: Medium  . Ulcerative colitis ([place]) 06/11/2017    Priority: Medium  . CLL (chronic lymphocytic leukemia) ([place]) 06/11/2017    Priority: Low  . Polyp of colon 06/11/2017    Priority: Low  . B12 deficiency 02/11/2019  . Family history of B12 deficiency 10/06/2018  . History of irregular heartbeat-  infreq episodes and asymptomatic lasting less than 1-2 minutes 05/05/2018  . Counseling on health promotion and disease prevention 11/20/2017  . Hyperkeratotic Skin lesion of right leg- changing recently 11/20/2017  . Vitamin D insufficiency 06/24/2017  . GERD (gastroesophageal reflux disease)  06/11/2017  . S/P hysterectomy- about 56yr ago 06/11/2017  . Other fatigue 06/11/2017  . Osteoporosis screening 06/11/2017     Current Meds  Medication Sig  . Alcohol Swabs (B-D SINGLE USE SWABS REGULAR) PADS 1 Package by Does not apply route 2 (two) times daily.  .Marland Kitchenatorvastatin (LIPITOR) 10 MG tablet TAKE 1 TABLET EVERY DAY  . Blood Glucose Calibration (ACCU-CHEK AVIVA) SOLN 1 drop by In Vitro route 2 (two) times daily.  . Cholecalciferol (VITAMIN D3) 5000 units TABS 5,000 IU OTC vitamin D3 daily.  . cyanocobalamin 1000 MCG tablet Take 1,000 mcg by mouth daily.  . hydrochlorothiazide (HYDRODIURIL) 25 MG tablet Take 1 tablet (25 mg total) by mouth daily.  .Marland Kitchenlosartan (COZAAR) 100 MG tablet TAKE 1 TABLET EVERY DAY  . metFORMIN (GLUCOPHAGE) 500 MG tablet TAKE 1/2 TABLET TWICE DAILY WITH A MEAL  . predniSONE (DELTASONE) 10 MG tablet Take 4 tablets (40 mg total) by mouth daily with breakfast for 14 days.  . Vitamin D, Ergocalciferol, (DRISDOL) 1.25 MG (50000 UT) CAPS capsule TAKE 1 CAPSULE BY MOUTH EVERY 7 DAYS.     Allergies:  No Known Allergies   ROS:  See above HPI for pertinent positives and negatives   Objective:   Height 5' 5.5" (1.664 m), weight 168 lb (76.2 kg).  -  General: sounds in no acute distress.  Skin: Pt confirms warm and dry  extremities and pink fingertips Respiratory: speaking in full sentences, no conversational dyspnea Psych: A and O *3, appears insight good, mood- full      Impression and Recommendations:     1. Diabetes mellitus without complication ([place] -Not checking blood sugars at home at all which I highly encouraged her to do.  She does have equipment to do so. -Tolerating medications, stable. -Reviewed fasting blood sugar and 2-hour postprandial and any time she feels bad. -Last A1c was 6.2 end of November 2019.  No need to bring patient in acutely during these times. -Patient knows to bring in her blood pressure and blood sugar logs next  office visit end of July -She will need a full set of labs to include her cholesterol and ALT as well as A1c etc.  2. Hypertension associated with diabetes ([place] Patient did order a blood pressure cuff and thermometer through [facility] helped her do it.  She wanted to have this so that when she had these visits with various doctors she could have a means to check things. -We reviewed that she should check it Monday morning, Wednesday afternoon, and Friday evening and anytime she feels dizzy or lightheaded.  3. Hyperlipidemia associated with type 2 diabetes mellitus ([place] Cholesterol will be due in June 2020, patient will come in in July to obtain it and have follow-up afterward with me. -Continue meds, LDL at goal.     I discussed the assessment and treatment plan with the patient. The patient  was provided an opportunity to ask questions and all were answered.  - The patient agreed with the plan and demonstrated an understanding of the instructions.   No barriers to understanding were identified.  Red flag symptoms and signs discussed in detail.  Patient expressed understanding regarding what to do in case of emergency\urgent symptoms   The patient was advised to call back or seek an in-person evaluation if the symptoms worsen or if the condition fails to improve as anticipated.   Return for end of July/ early Aug- come in for full set FBW- then OV with me 1 wk later.     Medications Discontinued During This Encounter  Medication Reason  . Calcium Carbonate-Vitamin D 600-400 MG-UNIT tablet Change in therapy       I provided 19+ minutes of non-face-to-face time during this encounter.   Mellody Dance, DO

## 2019-02-12 ENCOUNTER — Ambulatory Visit: Payer: Medicare PPO | Admitting: Family Medicine

## 2019-02-15 ENCOUNTER — Telehealth: Payer: Self-pay | Admitting: Physician Assistant

## 2019-02-15 NOTE — Telephone Encounter (Signed)
Pt needs prescription for UC, she states that her insurance not longer covers Apriso so she needs something different, she is inquiring if she could take sulfasalazine because it is covered by her insurance. If possible, she needs a 90 day supply sent to [facility].

## 2019-02-15 NOTE — Telephone Encounter (Signed)
Dr Silverio Decamp Please advise

## 2019-02-16 MED ORDER — FOLIC ACID 1 MG PO TABS: 1.0000 mg | ORAL_TABLET | Freq: Every day | ORAL | 3 refills | Status: DC

## 2019-02-16 MED ORDER — SULFASALAZINE 500 MG PO TABS: 1000.0000 mg | ORAL_TABLET | Freq: Two times a day (BID) | ORAL | 3 refills | Status: DC

## 2019-02-16 NOTE — Telephone Encounter (Signed)
Ok to switch to Sulfasalazine 1gm BID, also need to take Folic acid 43m daily along with it. Please schedule a virtual visit in 2 weeks. Thanks

## 2019-02-16 NOTE — Telephone Encounter (Signed)
Sent in rx for Sulfasalazine and Folic Acid to patients pharmacy as requested

## 2019-03-08 ENCOUNTER — Telehealth: Payer: Self-pay | Admitting: Gastroenterology
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
## 2019-03-09 NOTE — Telephone Encounter (Signed)
Please schedule follow up tele visit next available. Thanks

## 2019-03-09 NOTE — Telephone Encounter (Signed)
Spoke with the patient. She is having a better day today. She will continue the periodic Tylenol at 500 mg as directed. Keep the already scheduled televisit on 03/19/19 with Dr Silverio Decamp.

## 2019-03-12 ENCOUNTER — Inpatient Hospital Stay: Payer: Medicare PPO

## 2019-03-12 ENCOUNTER — Ambulatory Visit: Payer: Medicare PPO | Admitting: Hematology & Oncology

## 2019-03-18 ENCOUNTER — Encounter: Payer: Self-pay | Admitting: Gastroenterology

## 2019-03-19 ENCOUNTER — Ambulatory Visit: Payer: Medicare PPO | Admitting: Gastroenterology

## 2019-04-08 ENCOUNTER — Other Ambulatory Visit: Payer: Self-pay | Admitting: Family Medicine

## 2019-04-08 DIAGNOSIS — E119 Type 2 diabetes mellitus without complications: Secondary | ICD-10-CM

## 2019-04-12 ENCOUNTER — Ambulatory Visit (INDEPENDENT_AMBULATORY_CARE_PROVIDER_SITE_OTHER): Payer: Medicare PPO | Admitting: Gastroenterology

## 2019-04-12 ENCOUNTER — Encounter: Payer: Self-pay | Admitting: Gastroenterology

## 2019-04-12 ENCOUNTER — Other Ambulatory Visit: Payer: Self-pay

## 2019-04-12 VITALS — Ht 65.0 in | Wt 164.0 lb

## 2019-04-12 DIAGNOSIS — K51311 Ulcerative (chronic) rectosigmoiditis with rectal bleeding: Secondary | ICD-10-CM | POA: Diagnosis not present

## 2019-04-12 DIAGNOSIS — K51411 Inflammatory polyps of colon with rectal bleeding: Secondary | ICD-10-CM | POA: Diagnosis not present

## 2019-04-12 MED ORDER — MESALAMINE 1000 MG RE SUPP: 1000.0000 mg | Freq: Every day | RECTAL | 3 refills | Status: DC

## 2019-04-12 MED ORDER — MESALAMINE 1.2 G PO TBEC: 4.8000 g | DELAYED_RELEASE_TABLET | Freq: Every day | ORAL | 3 refills | Status: DC

## 2019-04-12 NOTE — Progress Notes (Signed)
Mackenzie Barber    [id number]    [birth year]  Primary Care Physician:Opalski, Neoma Laming, DO  Referring Physician: Mellody Dance, [address], [place] [address]  This service was provided via audio only telemedicine (Doximity) due to [place] 19 pandemic.  Patient location: Home Provider location: Office Used 2 patient identifiers to confirm the correct person. Explained the limitations in evaluation and management via telemedicine. Patient is aware of potential medical charges for this visit.  Patient consented to this virtual visit.  The persons participating in this telemedicine service were myself and the patient   Chief complaint: Ulcerative colitis, rectal bleeding HPI: 78 year old female with history of ulcerative colitis, last seen in March 2020 for follow-up visit after colonoscopy. No longer having rectal bleeding.  A small volume pink-tinged fluid when she wiped after having multiple bowel movements.  In the past 2 weeks she is having on and off  increased bowel frequency, 3-4 formed bowel movements.  Denies any liquid stool.  No abdominal pain, nausea, vomiting, decreased appetite or weight loss. She is not tolerating sulfasalazine very well,  Lialda was not covered by her insurance she wants to go back on it and see if she feels better.   Colonoscopy February 05, 2019 by Dr. Rush Landmark showed moderate chronic active colitis in the left colon and multiple inflammatory polyps, 1 of the polyps that was actively oozing with ulcerated tip was removed with cautery and hemostatic clip was placed.  Outpatient Encounter Medications as of 04/12/2019  Medication Sig  . Alcohol Swabs (B-D SINGLE USE SWABS REGULAR) PADS 1 Package by Does not apply route 2 (two) times daily.  Marland Kitchen atorvastatin (LIPITOR) 10 MG tablet TAKE 1 TABLET EVERY DAY  . Blood Glucose Calibration (ACCU-CHEK AVIVA) SOLN 1 drop by In Vitro route 2 (two) times daily.  . cyanocobalamin 1000 MCG  tablet Take 1,000 mcg by mouth daily.  . folic acid (FOLVITE) 1 MG tablet Take 1 tablet (1 mg total) by mouth daily.  . hydrochlorothiazide (HYDRODIURIL) 25 MG tablet Take 1 tablet (25 mg total) by mouth daily.  Marland Kitchen losartan (COZAAR) 100 MG tablet TAKE 1 TABLET EVERY DAY  . mesalamine (LIALDA) 1.2 g EC tablet Take 4 tablets (4.8 g total) by mouth daily with breakfast.  . metFORMIN (GLUCOPHAGE) 500 MG tablet TAKE 1/2 TABLET TWICE DAILY WITH A MEAL  . sulfaSALAzine (AZULFIDINE) 500 MG tablet Take 2 tablets (1,000 mg total) by mouth 2 (two) times daily.  . Vitamin D, Ergocalciferol, (DRISDOL) 1.25 MG (50000 UT) CAPS capsule TAKE 1 CAPSULE BY MOUTH EVERY 7 DAYS.   No facility-administered encounter medications on file as of 04/12/2019.     Allergies as of 04/12/2019  . (No Known Allergies)    Past Medical History:  Diagnosis Date  . 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 Diabetes mellitus without complication ([place])   . Hyperlipidemia   . Hypertension   . Ulcerative proctitis [facility])     Past Surgical History:  Procedure Laterality Date  . ABDOMINAL HYSTERECTOMY    . TONSILLECTOMY AND ADENOIDECTOMY      Family History  Problem Relation Age of Onset  . Cancer Mother        colon  . Hyperlipidemia Mother   . Hypertension Mother   . Colon cancer Mother   . Esophageal cancer Neg Hx   . Rectal cancer Neg Hx   . Stomach cancer Neg Hx  Social History   Socioeconomic History  . Marital status: Single    Spouse name: Not on file  . Number of children: 2  . Years of education: Not on file  . Highest education level: Not on file  Occupational History  . Occupation: retired  Scientific laboratory technician  . Financial resource strain: Not on file  . Food insecurity:    Worry: Not on file    Inability: Not on file  . Transportation needs:    Medical: Not on file    Non-medical: Not on file  Tobacco Use  . Smoking status: Never Smoker  . Smokeless tobacco: Never Used  Substance  and Sexual Activity  . Alcohol use: No  . Drug use: No  . Sexual activity: Never  Lifestyle  . Physical activity:    Days per week: Not on file    Minutes per session: Not on file  . Stress: Not on file  Relationships  . Social connections:    Talks on phone: Not on file    Gets together: Not on file    Attends religious service: Not on file    Active member of club or organization: Not on file    Attends meetings of clubs or organizations: Not on file    Relationship status: Not on file  . Intimate partner violence:    Fear of current or ex partner: Not on file    Emotionally abused: Not on file    Physically abused: Not on file    Forced sexual activity: Not on file  Other Topics Concern  . Not on file  Social History Narrative  . Not on file      Review of systems: Review of Systems as per HPI All other systems reviewed and are negative.   Physical Exam: Vitals were not taken and physical exam was not performed during this virtual visit.  Data Reviewed:  Reviewed labs, radiology imaging, old records and pertinent past GI work up   Assessment and Plan/Recommendations:  78 year old female with left-sided ulcerative colitis, multiple inflammatory polyps with persistent intermittent bright red blood per rectum Will switch to Lialda 4.8 g daily Start Canasa suppositories daily at bedtime Schedule flexible sigmoidoscopy with polypectomy If continues to have active colitis, will consider stepping up therapy for UC Follow-up after flexible sigmoidoscopy  The risks and benefits as well as alternatives of endoscopic procedure(s) have been discussed and reviewed. All questions answered. The patient agrees to proceed.     Damaris Hippo , MD   CC: Mellody Dance, DO

## 2019-04-12 NOTE — Patient Instructions (Addendum)
Lialda 4.8 g daily X refill for 1 year  Stop sulfasalazine  Canasa suppositories daily at bedtime as needed X refill for 3 months  Schedule flexible sigmoidoscopy at [facility] endoscopy, next available appointment ( We will contact you with that appointment)   I appreciate the  opportunity to care for you  Thank You   Harl Bowie , MD

## 2019-04-14 ENCOUNTER — Encounter: Payer: Self-pay | Admitting: Gastroenterology

## 2019-04-14 ENCOUNTER — Telehealth: Payer: Self-pay | Admitting: *Deleted

## 2019-04-14 ENCOUNTER — Other Ambulatory Visit: Payer: Self-pay | Admitting: *Deleted

## 2019-04-14 DIAGNOSIS — K51311 Ulcerative (chronic) rectosigmoiditis with rectal bleeding: Secondary | ICD-10-CM

## 2019-04-14 NOTE — Telephone Encounter (Signed)
Put in case for Flex sig at [facility] for 05/31/2019 waiting on patient to return my call to verify this date before I schedule, will also need COVID19 testing before procedure

## 2019-04-15 ENCOUNTER — Other Ambulatory Visit: Payer: Self-pay | Admitting: *Deleted

## 2019-04-15 DIAGNOSIS — K51311 Ulcerative (chronic) rectosigmoiditis with rectal bleeding: Secondary | ICD-10-CM

## 2019-04-15 NOTE — Telephone Encounter (Signed)
Flex sig scheduled for 05/31/2019 at 9:30am at [facility]  Will need COVID19 pre procedure screening

## 2019-04-15 NOTE — Telephone Encounter (Signed)
Scheduled patient at [facility] on 05/31/2019 at 9:30am

## 2019-04-15 NOTE — Telephone Encounter (Signed)
Patient returned phone call. °

## 2019-04-16 ENCOUNTER — Telehealth: Payer: Self-pay | Admitting: Gastroenterology

## 2019-04-16 NOTE — Telephone Encounter (Signed)
Discussed FLEX Sig date and times with with patient and the date and time of her COVID19 screening Will mail patient her instructions today

## 2019-04-16 NOTE — Telephone Encounter (Signed)
Spoke with patient about Flex Sig scheduled at [facility] on 05/31/2019 at 9:30am and her [place] screening See other phone note

## 2019-04-16 NOTE — Telephone Encounter (Signed)
Patient return your call

## 2019-04-16 NOTE — Telephone Encounter (Signed)
L/M for pt to return call,  Scheduled COVID screening for 05/27/2019

## 2019-04-21 MED ORDER — HYDROCORTISONE ACETATE 25 MG RE SUPP: 25.0000 mg | Freq: Every day | RECTAL | 1 refills | Status: DC

## 2019-04-21 NOTE — Telephone Encounter (Signed)
Yes continue Folic acid along with Lialda. Can you please check if insurance will cover Anusol suppository daily at bedtime X 2 weeks. Thanks

## 2019-04-21 NOTE — Telephone Encounter (Signed)
She Also wants to know why she is having this procedure at [facility] is asking her

## 2019-04-21 NOTE — Telephone Encounter (Signed)
She has large inflammatory polyps in the sigmoid colon at risk for significant bleeding, scheduled at [facility] endoscopy so we have all the equipment needed in case of significant bleeding.  Medicare usually does not differentiate whether it is done at [facility] or hospital

## 2019-04-21 NOTE — Telephone Encounter (Signed)
Insurance will not cover Canasa but will cover the Lialda  Do you want patient to continue Folic Acid ?   Patient also wants to know why she is having this procedure at the hospital, Her insurance company is asking her

## 2019-04-21 NOTE — Addendum Note (Signed)
Addended by: Oda Kilts on: 04/21/2019 11:21 AM   Modules accepted: Orders

## 2019-04-21 NOTE — Telephone Encounter (Signed)
Pt requested a call back--she has questions regarding flex Sig.

## 2019-04-22 ENCOUNTER — Telehealth: Payer: Self-pay | Admitting: Family Medicine

## 2019-04-22 DIAGNOSIS — I10 Essential (primary) hypertension: Secondary | ICD-10-CM

## 2019-04-22 MED ORDER — HYDROCHLOROTHIAZIDE 25 MG PO TABS: 25.0000 mg | ORAL_TABLET | Freq: Every day | ORAL | 1 refills | Status: DC

## 2019-04-22 NOTE — Telephone Encounter (Signed)
Patient is requesting a refill of her hydrochlorothiazide, she is requesting a 90 day supply with 3 refills. If approved please send to [facility] (not [facility])

## 2019-04-22 NOTE — Addendum Note (Signed)
Addended by: Lanier Prude D on: 04/22/2019 01:31 PM   Modules accepted: Orders

## 2019-04-22 NOTE — Telephone Encounter (Signed)
Refills sent in per office policy. MPulliam, CMA/RT(R)

## 2019-04-22 NOTE — Telephone Encounter (Signed)
Answered all patients questions

## 2019-05-17 ENCOUNTER — Ambulatory Visit (HOSPITAL_COMMUNITY): Admit: 2019-05-17 | Payer: Medicare PPO | Admitting: Gastroenterology

## 2019-05-17 ENCOUNTER — Encounter (HOSPITAL_COMMUNITY): Payer: Self-pay

## 2019-05-17 SURGERY — SIGMOIDOSCOPY, FLEXIBLE
Anesthesia: Moderate Sedation

## 2019-05-19 ENCOUNTER — Other Ambulatory Visit: Payer: Self-pay | Admitting: Gastroenterology

## 2019-05-19 MED ORDER — MESALAMINE 1.2 G PO TBEC: 4.8000 g | DELAYED_RELEASE_TABLET | Freq: Every day | ORAL | 3 refills | Status: DC

## 2019-05-19 NOTE — Telephone Encounter (Signed)
Prescription for Lialda 4.8 g daily 90 day supply sent to [place]. Patient verbalized understanding.

## 2019-05-21 ENCOUNTER — Other Ambulatory Visit: Payer: Self-pay

## 2019-05-21 ENCOUNTER — Other Ambulatory Visit: Payer: Medicare PPO

## 2019-05-21 ENCOUNTER — Ambulatory Visit: Payer: Medicare PPO | Admitting: Hematology & Oncology

## 2019-05-21 MED ORDER — MESALAMINE 1.2 G PO TBEC: 4.8000 g | DELAYED_RELEASE_TABLET | Freq: Every day | ORAL | 3 refills | Status: DC

## 2019-05-26 ENCOUNTER — Telehealth: Payer: Self-pay | Admitting: Gastroenterology

## 2019-05-26 NOTE — Telephone Encounter (Signed)
Pt requested a call back to discuss COVID-19 test.

## 2019-05-26 NOTE — Telephone Encounter (Signed)
Answered all questions told patient to call back as needed

## 2019-05-27 ENCOUNTER — Other Ambulatory Visit (HOSPITAL_COMMUNITY)
Admission: RE | Admit: 2019-05-27 | Discharge: 2019-05-27 | Disposition: A | Discharge status: Home / Self Care | Payer: Medicare PPO | Source: Ambulatory Visit | Attending: Gastroenterology | Admitting: Gastroenterology

## 2019-05-27 ENCOUNTER — Other Ambulatory Visit: Payer: Self-pay | Admitting: *Deleted

## 2019-05-27 DIAGNOSIS — K51411 Inflammatory polyps of colon with rectal bleeding: Secondary | ICD-10-CM | POA: Diagnosis not present

## 2019-05-27 DIAGNOSIS — Z856 Personal history of leukemia: Secondary | ICD-10-CM | POA: Diagnosis not present

## 2019-05-27 DIAGNOSIS — E119 Type 2 diabetes mellitus without complications: Secondary | ICD-10-CM | POA: Diagnosis not present

## 2019-05-27 DIAGNOSIS — K51311 Ulcerative (chronic) rectosigmoiditis with rectal bleeding: Secondary | ICD-10-CM | POA: Diagnosis not present

## 2019-05-27 DIAGNOSIS — I10 Essential (primary) hypertension: Secondary | ICD-10-CM | POA: Diagnosis not present

## 2019-05-27 DIAGNOSIS — Z79899 Other long term (current) drug therapy: Secondary | ICD-10-CM | POA: Diagnosis not present

## 2019-05-27 DIAGNOSIS — Z7984 Long term (current) use of oral hypoglycemic drugs: Secondary | ICD-10-CM | POA: Diagnosis not present

## 2019-05-27 DIAGNOSIS — Z1159 Encounter for screening for other viral diseases: Secondary | ICD-10-CM | POA: Diagnosis not present

## 2019-05-27 DIAGNOSIS — K621 Rectal polyp: Secondary | ICD-10-CM | POA: Diagnosis not present

## 2019-05-27 DIAGNOSIS — K921 Melena: Secondary | ICD-10-CM | POA: Diagnosis not present

## 2019-05-27 DIAGNOSIS — E785 Hyperlipidemia, unspecified: Secondary | ICD-10-CM | POA: Diagnosis not present

## 2019-05-27 LAB — SARS CORONAVIRUS 2 (TAT 6-24 HRS): SARS Coronavirus 2: NEGATIVE

## 2019-05-27 NOTE — Progress Notes (Signed)
Medication already filled on 05/21/2019 to [facility] fax from pharmacy

## 2019-05-28 NOTE — Progress Notes (Signed)
Spoke with patient about Endoscopy procedure on Monday 7/20 and ensured patient has remained quarantined since COVID test, and that she has not been around anyone sick nor has had symptoms of being sick. All questions addressed.

## 2019-05-30 ENCOUNTER — Telehealth: Payer: Self-pay | Admitting: Nurse Practitioner

## 2019-05-30 NOTE — Telephone Encounter (Signed)
Patient called answering service at 8am to verify when she should start her clear liquid diet, instructions given to her from our office instruct pt to start clear liquids today at dinner time. She will eat a light lunch and then clear liquids, to follow handout instructions with prep instructions. Pt stated she did not have any further questions. She stated she is scheduled for a flexible sigmoidoscopy with Dr. Silverio Decamp tomorrow.

## 2019-05-31 ENCOUNTER — Other Ambulatory Visit: Payer: Self-pay

## 2019-05-31 ENCOUNTER — Ambulatory Visit (HOSPITAL_COMMUNITY)
Admission: RE | Admit: 2019-05-31 | Discharge: 2019-05-31 | Disposition: A | Discharge status: Home / Self Care | Payer: Medicare PPO | Attending: Gastroenterology | Admitting: Gastroenterology

## 2019-05-31 ENCOUNTER — Encounter (HOSPITAL_COMMUNITY): Payer: Self-pay

## 2019-05-31 ENCOUNTER — Encounter (HOSPITAL_COMMUNITY)
Admission: RE | Disposition: A | Discharge status: Home / Self Care | Payer: Self-pay | Source: Home / Self Care | Attending: Gastroenterology

## 2019-05-31 DIAGNOSIS — Z79899 Other long term (current) drug therapy: Secondary | ICD-10-CM | POA: Insufficient documentation

## 2019-05-31 DIAGNOSIS — Z856 Personal history of leukemia: Secondary | ICD-10-CM | POA: Insufficient documentation

## 2019-05-31 DIAGNOSIS — K51411 Inflammatory polyps of colon with rectal bleeding: Secondary | ICD-10-CM | POA: Diagnosis not present

## 2019-05-31 DIAGNOSIS — Z860100 Personal history of colon polyps, unspecified: Secondary | ICD-10-CM

## 2019-05-31 DIAGNOSIS — D125 Benign neoplasm of sigmoid colon: Secondary | ICD-10-CM

## 2019-05-31 DIAGNOSIS — Z7984 Long term (current) use of oral hypoglycemic drugs: Secondary | ICD-10-CM | POA: Insufficient documentation

## 2019-05-31 DIAGNOSIS — K921 Melena: Secondary | ICD-10-CM | POA: Diagnosis not present

## 2019-05-31 DIAGNOSIS — K51311 Ulcerative (chronic) rectosigmoiditis with rectal bleeding: Secondary | ICD-10-CM | POA: Diagnosis not present

## 2019-05-31 DIAGNOSIS — K621 Rectal polyp: Secondary | ICD-10-CM

## 2019-05-31 DIAGNOSIS — Z1159 Encounter for screening for other viral diseases: Secondary | ICD-10-CM | POA: Insufficient documentation

## 2019-05-31 DIAGNOSIS — E119 Type 2 diabetes mellitus without complications: Secondary | ICD-10-CM | POA: Diagnosis not present

## 2019-05-31 DIAGNOSIS — I10 Essential (primary) hypertension: Secondary | ICD-10-CM | POA: Insufficient documentation

## 2019-05-31 DIAGNOSIS — E785 Hyperlipidemia, unspecified: Secondary | ICD-10-CM | POA: Diagnosis not present

## 2019-05-31 DIAGNOSIS — D127 Benign neoplasm of rectosigmoid junction: Secondary | ICD-10-CM | POA: Diagnosis not present

## 2019-05-31 HISTORY — PX: POLYPECTOMY: SHX5525

## 2019-05-31 HISTORY — PX: FLEXIBLE SIGMOIDOSCOPY: SHX5431

## 2019-05-31 HISTORY — PX: HEMOSTASIS CLIP PLACEMENT: SHX6857

## 2019-05-31 LAB — GLUCOSE, CAPILLARY: Glucose-Capillary: 102 mg/dL — ABNORMAL HIGH (ref 70–99)

## 2019-05-31 SURGERY — SIGMOIDOSCOPY, FLEXIBLE
Anesthesia: Moderate Sedation

## 2019-05-31 MED ORDER — FENTANYL CITRATE (PF) 100 MCG/2ML IJ SOLN
INTRAMUSCULAR | Status: AC
  Filled 2019-05-31: qty 2

## 2019-05-31 MED ORDER — LACTATED RINGERS IV SOLN: INTRAVENOUS | Status: DC

## 2019-05-31 MED ORDER — MIDAZOLAM HCL (PF) 10 MG/2ML IJ SOLN
INTRAMUSCULAR | Status: DC | PRN
  Administered 2019-05-31: 1 mg via INTRAVENOUS
  Administered 2019-05-31: 2 mg via INTRAVENOUS
  Administered 2019-05-31: 1 mg via INTRAVENOUS

## 2019-05-31 MED ORDER — SODIUM CHLORIDE 0.9 % IV SOLN: INTRAVENOUS | Status: DC

## 2019-05-31 MED ORDER — FENTANYL CITRATE (PF) 100 MCG/2ML IJ SOLN
INTRAMUSCULAR | Status: DC | PRN
  Administered 2019-05-31: 50 ug via INTRAVENOUS
  Administered 2019-05-31: 25 ug via INTRAVENOUS

## 2019-05-31 MED ORDER — MIDAZOLAM HCL (PF) 5 MG/ML IJ SOLN
INTRAMUSCULAR | Status: AC
  Filled 2019-05-31: qty 2

## 2019-05-31 NOTE — H&P (Signed)
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   Colonoscopy February 05, 2019 by Dr. Rush Landmark showed moderate chronic active colitis in the left colon and multiple inflammatory polyps, 1 of the polyps that was actively oozing with ulcerated tip was removed with cautery and hemostatic clip was placed.  Here for follow up sigmoidoscopy and possible polypectomy.     Past Medical History:  Diagnosis Date  . 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 Diabetes mellitus without complication ([place])   . Hyperlipidemia   . Hypertension   . Ulcerative proctitis [facility])     Past Surgical History:  Procedure Laterality Date  . ABDOMINAL HYSTERECTOMY    . TONSILLECTOMY AND ADENOIDECTOMY      Prior to Admission medications   Medication Sig Start Date End Date Taking? Authorizing Provider  acetaminophen (TYLENOL) 500 MG tablet Take 1,000 mg by mouth every 6 (six) hours as needed (pain.).   Yes [provider]  Alcohol Swabs (B-D SINGLE USE SWABS REGULAR) PADS 1 Package by Does not apply route 2 (two) times daily. 07/07/17  Yes Opalski, Deborah, DO  atorvastatin (LIPITOR) 10 MG tablet TAKE 1 TABLET EVERY DAY Patient taking differently: Take 10 mg by mouth at bedtime.  11/02/18  Yes Opalski, Neoma Laming, DO  Blood Glucose Calibration (ACCU-CHEK AVIVA) SOLN 1 drop by In Vitro route 2 (two) times daily. 07/07/17  Yes Opalski, Neoma Laming, DO  hydrochlorothiazide (HYDRODIURIL) 25 MG tablet Take 1 tablet (25 mg total) by mouth daily. 04/22/19  Yes Opalski, Neoma Laming, DO  hydrocortisone (ANUSOL-HC) 25 MG suppository Place 1 suppository (25 mg total) rectally at bedtime. 04/21/19  Yes Nandigam, Venia Minks, MD  losartan (COZAAR)  100 MG tablet TAKE 1 TABLET EVERY DAY Patient taking differently: Take 100 mg by mouth daily.  11/02/18  Yes Opalski, Neoma Laming, DO  mesalamine (LIALDA) 1.2 g EC tablet Take 4 tablets (4.8 g total) by mouth daily with breakfast. 05/21/19  Yes Nandigam, Kavitha V, MD  metFORMIN (GLUCOPHAGE) 500 MG tablet TAKE 1/2 TABLET TWICE DAILY WITH A MEAL Patient taking differently: Take 500 mg by mouth daily.  04/08/19  Yes Opalski, Deborah, DO  Vitamin D, Ergocalciferol, (DRISDOL) 1.25 MG (50000 UT) CAPS capsule TAKE 1 CAPSULE BY MOUTH EVERY 7 DAYS. Patient taking differently: Take 50,000 Units by mouth once a week. Swallow whole.  Do NOT chew or crush. (either Sundays or Mondays) 10/26/18  Yes Opalski, Neoma Laming, DO  cyanocobalamin 1000 MCG tablet Take 1,000 mcg by mouth daily.    [provider]  folic acid (FOLVITE) 1 MG tablet Take 1 tablet (1 mg total) by mouth daily. 02/16/19   Mauri Pole, MD  mesalamine (CANASA) 1000 MG suppository Place 1 suppository (1,000 mg total) rectally at bedtime. As needed Patient not taking: Reported on 05/25/2019 04/12/19   Mauri Pole, MD  sulfaSALAzine (AZULFIDINE) 500 MG tablet Take 2 tablets (1,000 mg total) by mouth 2 (two) times daily. Patient not taking: Reported on 05/25/2019 02/16/19   Mauri Pole, MD    Current Facility-Administered Medications  Medication Dose Route Frequency Provider Last Rate Last Dose  . 0.9 %  sodium chloride infusion   Intravenous Continuous Nandigam, Venia Minks, MD      .  lactated ringers infusion   Intravenous Continuous Mauri Pole, MD        Allergies as of 04/15/2019  . (No Known Allergies)    Family History  Problem Relation Age of Onset  . Cancer Mother        colon  . Hyperlipidemia Mother   . Hypertension Mother   . Colon cancer Mother   . Esophageal cancer Neg Hx   . Rectal cancer Neg Hx   . Stomach cancer Neg Hx     Social History   Socioeconomic History  . Marital status: Single     Spouse name: Not on file  . Number of children: 2  . Years of education: Not on file  . Highest education level: Not on file  Occupational History  . Occupation: retired  Scientific laboratory technician  . Financial resource strain: Not on file  . Food insecurity    Worry: Not on file    Inability: Not on file  . Transportation needs    Medical: Not on file    Non-medical: Not on file  Tobacco Use  . Smoking status: Never Smoker  . Smokeless tobacco: Never Used  Substance and Sexual Activity  . Alcohol use: No  . Drug use: No  . Sexual activity: Never  Lifestyle  . Physical activity    Days per week: Not on file    Minutes per session: Not on file  . Stress: Not on file  Relationships  . Social Herbalist on phone: Not on file    Gets together: Not on file    Attends religious service: Not on file    Active member of club or organization: Not on file    Attends meetings of clubs or organizations: Not on file    Relationship status: Not on file  . Intimate partner violence    Fear of current or ex partner: Not on file    Emotionally abused: Not on file    Physically abused: Not on file    Forced sexual activity: Not on file  Other Topics Concern  . Not on file  Social History Narrative  . Not on file    Review of Systems:  All other review of systems negative except as mentioned in the HPI.  Physical Exam: Vital signs in last 24 hours: Temp:  [98 F (36.7 C)] 98 F (36.7 C) (07/20 0831) Pulse Rate:  [64] 64 (07/20 0831) Resp:  [16] 16 (07/20 0831) BP: (158)/(88) 158/88 (07/20 0831) SpO2:  [97 %] 97 % (07/20 0831) Weight:  [76.2 kg] 76.2 kg (07/20 0831)   General:   Alert,  Well-developed, well-nourished, pleasant and cooperative in NAD Lungs:  Clear throughout to auscultation.   Heart:  Regular rate and rhythm; no murmurs, clicks, rubs,  or gallops. Abdomen:  Soft, nontender and nondistended. Normal bowel sounds.   Neuro/Psych:  Alert and cooperative. Normal mood  and affect. A and O x 3   K. Denzil Magnuson , MD [phone]

## 2019-05-31 NOTE — Op Note (Addendum)
[facility] Patient Name: Mackenzie Barber Procedure Date: 05/31/2019 MRN: [id number] Attending MD: Mauri Pole , MD Date of Birth: [birth year] CSN: [id number] Age: 78 Admit Type: Outpatient Procedure:                Flexible Sigmoidoscopy Indications:              Rectal hemorrhage, Hematochezia, For therapy of                            inflammatory polyps in the colon Providers:                Mauri Pole, MD, Cleda Daub, RN, Elspeth Cho Tech., Technician Referring MD:             Mellody Dance Medicines:                Fentanyl 75 micrograms IV, Midazolam 4 mg IV Complications:            No immediate complications. Estimated Blood Loss:     Estimated blood loss was minimal. Procedure:                Pre-Anesthesia Assessment:                           - Prior to the procedure, a History and Physical                            was performed, and patient medications and                            allergies were reviewed. The patient's tolerance of                            previous anesthesia was also reviewed. The risks                            and benefits of the procedure and the sedation                            options and risks were discussed with the patient.                            All questions were answered, and informed consent                            was obtained. Prior Anticoagulants: The patient has                            taken no previous anticoagulant or antiplatelet                            agents. ASA Grade Assessment: II - A patient with  mild systemic disease. After reviewing the risks                            and benefits, the patient was deemed in                            satisfactory condition to undergo the procedure.                           After obtaining informed consent, the scope was                            passed under direct vision. The  [id number]-H190DL                            ([id number]) Olympus pediatric colonscope was                            introduced through the anus and advanced to the the                            descending colon. The flexible sigmoidoscopy was                            accomplished without difficulty. The patient                            tolerated the procedure well. The quality of the                            bowel preparation was excellent. Scope In: 9:20:15 AM Scope Out: 9:40:37 AM Total Procedure Duration: 0 hours 20 minutes 22 seconds  Findings:      The perianal and digital rectal examinations were normal.      Three semi-pedunculated polyps were found in the recto-sigmoid colon.       The polyps were large, 20-20m in size, multilobulated with ulcerated       mucosa and one of them was actively oozing blood. These polyps were       removed with a hot snare. Resection and retrieval were complete. To       prevent bleeding after the polypectomy, two hemostatic clips were       successfully placed (MR conditional) at one of the post polypectomy       site. There was no bleeding at the end of the procedure. Additional <1cm       inflammatory polyp not removed. Impression:               - Three large polyps at the recto-sigmoid colon,                            removed with a hot snare. Resected and retrieved.                            Clips (MR conditional) were placed. Moderate Sedation:      Moderate (conscious) sedation was administered by  the endoscopy nurse       and supervised by the endoscopist. The patient's oxygen saturation,       heart rate, blood pressure and response to care were monitored. Total       physician intraservice time was 25 minutes. Recommendation:           - Discharge patient to home.                           - Resume previous diet.                           - Continue present medications.                           - Continue Lialda                            - Continue Canasa                           - Await pathology report                           - No high dose aspirin, ibuprofen, naproxen, or                            other non-steroidal anti-inflammatory drugs for 2                            weeks after polyp removal.                           - Return to GI office at the next available                            appointment .                           - Repeat colonsocopy in 1 year for surveillance. Procedure Code(s):        --- Professional ---                           [phone], Sigmoidoscopy, flexible; with removal of                            tumor(s), polyp(s), or other lesion(s) by snare                            technique                           99153, Moderate sedation; each additional 15                            minutes intraservice time  G0500, Moderate sedation services provided by the                            same physician or other qualified health care                            professional performing a gastrointestinal                            endoscopic service that sedation supports,                            requiring the presence of an independent trained                            observer to assist in the monitoring of the                            patient's level of consciousness and physiological                            status; initial 15 minutes of intra-service time;                            patient age 79 years or older (additional time may                            be reported with [phone], as appropriate) Diagnosis Code(s):        --- Professional ---                           K63.5, Polyp of colon                           K62.5, Hemorrhage of anus and rectum                           K92.1, Melena (includes Hematochezia)                           K51.411, Inflammatory polyps of colon with rectal                            bleeding CPT copyright 2019 American  Medical Association. All rights reserved. The codes documented in this report are preliminary and upon coder review may  be revised to meet current compliance requirements. Mauri Pole, MD 05/31/2019 9:55:12 AM This report has been signed electronically. Number of Addenda: 0

## 2019-05-31 NOTE — Discharge Instructions (Signed)

## 2019-06-02 ENCOUNTER — Encounter (HOSPITAL_COMMUNITY): Payer: Self-pay | Admitting: Gastroenterology

## 2019-06-16 ENCOUNTER — Other Ambulatory Visit: Payer: Self-pay

## 2019-06-16 DIAGNOSIS — E119 Type 2 diabetes mellitus without complications: Secondary | ICD-10-CM

## 2019-06-16 MED ORDER — METFORMIN HCL 500 MG PO TABS: ORAL_TABLET | ORAL | 0 refills | Status: DC

## 2019-06-25 ENCOUNTER — Other Ambulatory Visit: Payer: Self-pay

## 2019-06-25 ENCOUNTER — Inpatient Hospital Stay (HOSPITAL_BASED_OUTPATIENT_CLINIC_OR_DEPARTMENT_OTHER): Payer: Medicare PPO | Admitting: Hematology & Oncology

## 2019-06-25 ENCOUNTER — Inpatient Hospital Stay: Payer: Medicare PPO | Attending: Hematology & Oncology

## 2019-06-25 ENCOUNTER — Encounter: Payer: Self-pay | Admitting: Hematology & Oncology

## 2019-06-25 VITALS — BP 139/70 | HR 73 | Temp 97.5°F | Resp 18 | Ht 65.5 in | Wt 165.1 lb

## 2019-06-25 DIAGNOSIS — C9110 Chronic lymphocytic leukemia of B-cell type not having achieved remission: Secondary | ICD-10-CM

## 2019-06-25 DIAGNOSIS — Z79899 Other long term (current) drug therapy: Secondary | ICD-10-CM | POA: Insufficient documentation

## 2019-06-25 DIAGNOSIS — C9190 Lymphoid leukemia, unspecified not having achieved remission: Secondary | ICD-10-CM | POA: Diagnosis not present

## 2019-06-25 DIAGNOSIS — K5190 Ulcerative colitis, unspecified, without complications: Secondary | ICD-10-CM | POA: Insufficient documentation

## 2019-06-25 DIAGNOSIS — R599 Enlarged lymph nodes, unspecified: Secondary | ICD-10-CM | POA: Diagnosis not present

## 2019-06-25 DIAGNOSIS — Z7984 Long term (current) use of oral hypoglycemic drugs: Secondary | ICD-10-CM | POA: Diagnosis not present

## 2019-06-25 LAB — CBC WITH DIFFERENTIAL (CANCER CENTER ONLY)
Abs Immature Granulocytes: 0.06 10*3/uL (ref 0.00–0.07)
Basophils Absolute: 0.1 10*3/uL (ref 0.0–0.1)
Basophils Relative: 0 %
Eosinophils Absolute: 0.1 10*3/uL (ref 0.0–0.5)
Eosinophils Relative: 0 %
HCT: 40.1 % (ref 36.0–46.0)
Hemoglobin: 13.3 g/dL (ref 12.0–15.0)
Immature Granulocytes: 0 %
Lymphocytes Relative: 75 %
Lymphs Abs: 20.3 10*3/uL — ABNORMAL HIGH (ref 0.7–4.0)
MCH: 30.3 pg (ref 26.0–34.0)
MCHC: 33.2 g/dL (ref 30.0–36.0)
MCV: 91.3 fL (ref 80.0–100.0)
Monocytes Absolute: 0.5 10*3/uL (ref 0.1–1.0)
Monocytes Relative: 2 %
Neutro Abs: 6.4 10*3/uL (ref 1.7–7.7)
Neutrophils Relative %: 23 %
Platelet Count: 255 10*3/uL (ref 150–400)
RBC: 4.39 MIL/uL (ref 3.87–5.11)
RDW: 12.2 % (ref 11.5–15.5)
WBC Count: 27.4 10*3/uL — ABNORMAL HIGH (ref 4.0–10.5)
nRBC: 0 % (ref 0.0–0.2)

## 2019-06-25 LAB — CMP (CANCER CENTER ONLY)
ALT: 21 U/L (ref 0–44)
AST: 21 U/L (ref 15–41)
Albumin: 4.4 g/dL (ref 3.5–5.0)
Alkaline Phosphatase: 69 U/L (ref 38–126)
Anion gap: 8 (ref 5–15)
BUN: 10 mg/dL (ref 8–23)
CO2: 29 mmol/L (ref 22–32)
Calcium: 9.3 mg/dL (ref 8.9–10.3)
Chloride: 96 mmol/L — ABNORMAL LOW (ref 98–111)
Creatinine: 0.77 mg/dL (ref 0.44–1.00)
GFR, Est AFR Am: >60 mL/min (ref >60)
GFR, Estimated: >60 mL/min (ref >60)
Glucose, Bld: 113 mg/dL — ABNORMAL HIGH (ref 70–99)
Potassium: 4.1 mmol/L (ref 3.5–5.1)
Sodium: 133 mmol/L — ABNORMAL LOW (ref 135–145)
Total Bilirubin: 0.4 mg/dL (ref 0.3–1.2)
Total Protein: 6.6 g/dL (ref 6.5–8.1)

## 2019-06-25 LAB — SAVE SMEAR(SSMR), FOR PROVIDER SLIDE REVIEW

## 2019-06-25 NOTE — Progress Notes (Signed)
Hematology and Oncology Follow Up Visit  Mackenzie Barber [id number] [birth year] 78 y.o. 06/25/2019   Principle Diagnosis:   CLL -- Stage A  Current Therapy:    Observation     Interim History:  Mackenzie Barber is back for follow-up.  She is doing pretty well.  We see her yearly.  Since we last saw her, she really has had no problems.  She is trying to avoid the coronavirus.  She has had no problem with fever.  The one issue has been her ulcerative colitis.  She says she is on a new medicine for this.  She did require a colonoscopy and a sigmoidoscopy earlier in the year.  She apparently has some bleeding with several polyps that were removed.  She has had no issues with nausea or vomiting.  She is not noted any swollen lymph nodes.  She has had no cough.  There is been no rash.  She has not had a mammogram for over a year.  She promises to get one done.  There is been no leg swelling.  She has had no headache.  There is been no visual changes.  Overall, her performance status is ECOG 1.  Medications:  Current Outpatient Medications:  .  acetaminophen (TYLENOL) 500 MG tablet, Take 1,000 mg by mouth every 6 (six) hours as needed (pain.)., Disp: , Rfl:  .  Alcohol Swabs (B-D SINGLE USE SWABS REGULAR) PADS, 1 Package by Does not apply route 2 (two) times daily., Disp: 100 each, Rfl: 0 .  atorvastatin (LIPITOR) 10 MG tablet, TAKE 1 TABLET EVERY DAY (Patient taking differently: Take 10 mg by mouth at bedtime. ), Disp: 90 tablet, Rfl: 0 .  Blood Glucose Calibration (ACCU-CHEK AVIVA) SOLN, 1 drop by In Vitro route 2 (two) times daily., Disp: 1 each, Rfl: 11 .  cyanocobalamin 1000 MCG tablet, Take 1,000 mcg by mouth daily., Disp: , Rfl:  .  hydrochlorothiazide (HYDRODIURIL) 25 MG tablet, Take 1 tablet (25 mg total) by mouth daily., Disp: 90 tablet, Rfl: 1 .  hydrocortisone (ANUSOL-HC) 25 MG suppository, Place 1 suppository (25 mg total) rectally at bedtime., Disp: 14 suppository, Rfl: 1 .   losartan (COZAAR) 100 MG tablet, TAKE 1 TABLET EVERY DAY (Patient taking differently: Take 100 mg by mouth daily. ), Disp: 90 tablet, Rfl: 0 .  mesalamine (LIALDA) 1.2 g EC tablet, Take 4 tablets (4.8 g total) by mouth daily with breakfast., Disp: 360 tablet, Rfl: 3 .  metFORMIN (GLUCOPHAGE) 500 MG tablet, TAKE 1/2 TABLET TWICE DAILY WITH A MEAL, Disp: 90 tablet, Rfl: 0 .  Vitamin D, Ergocalciferol, (DRISDOL) 1.25 MG (50000 UT) CAPS capsule, TAKE 1 CAPSULE BY MOUTH EVERY 7 DAYS. (Patient taking differently: Take 50,000 Units by mouth once a week. Swallow whole.  Do NOT chew or crush. (either Sundays or Mondays)), Disp: 12 capsule, Rfl: 10  Allergies: No Known Allergies  Past Medical History, Surgical history, Social history, and Family History were reviewed and updated.  Review of Systems: Review of Systems  Constitutional: Negative.   HENT:  Negative.   Eyes: Negative.   Respiratory: Negative.   Cardiovascular: Negative.   Gastrointestinal: Negative.   Endocrine: Negative.   Genitourinary: Negative.    Musculoskeletal: Negative.   Skin: Negative.   Neurological: Negative.   Hematological: Negative.   Psychiatric/Behavioral: Negative.     Physical Exam:  height is 5' 5.5" (1.664 m) and weight is 165 lb 1.9 oz (74.9 kg). Her temporal temperature is 97.5 F (36.4  C) (abnormal). Her blood pressure is 139/70 and her pulse is 73. Her respiration is 18 and oxygen saturation is 100%.   Wt Readings from Last 3 Encounters:  06/25/19 165 lb 1.9 oz (74.9 kg)  05/31/19 168 lb (76.2 kg)  04/12/19 164 lb (74.4 kg)    Physical Exam Vitals signs reviewed.  HENT:     Head: Normocephalic and atraumatic.  Eyes:     Pupils: Pupils are equal, round, and reactive to light.  Neck:     Musculoskeletal: Normal range of motion.  Cardiovascular:     Rate and Rhythm: Normal rate and regular rhythm.     Heart sounds: Normal heart sounds.  Pulmonary:     Effort: Pulmonary effort is normal.      Breath sounds: Normal breath sounds.  Abdominal:     General: Bowel sounds are normal.     Palpations: Abdomen is soft.  Musculoskeletal: Normal range of motion.        General: No tenderness or deformity.  Lymphadenopathy:     Cervical: No cervical adenopathy.  Skin:    General: Skin is warm and dry.     Findings: No erythema or rash.  Neurological:     Mental Status: She is alert and oriented to person, place, and time.  Psychiatric:        Behavior: Behavior normal.        Thought Content: Thought content normal.        Judgment: Judgment normal.      Lab Results  Component Value Date   WBC 27.4 (H) 06/25/2019   HGB 13.3 06/25/2019   HCT 40.1 06/25/2019   MCV 91.3 06/25/2019   PLT 255 06/25/2019     Chemistry      Component Value Date/Time   NA 133 (L) 06/25/2019 1512   NA 135 05/05/2018 0912   NA 137 08/14/2017 1216   K 4.1 06/25/2019 1512   K 3.7 08/14/2017 1216   CL 96 (L) 06/25/2019 1512   CL 101 08/14/2017 1216   CO2 29 06/25/2019 1512   CO2 28 08/14/2017 1216   BUN 10 06/25/2019 1512   BUN 9 05/05/2018 0912   BUN 8 08/14/2017 1216   CREATININE 0.77 06/25/2019 1512   CREATININE 0.8 08/14/2017 1216   GLU 122 11/21/2016      Component Value Date/Time   CALCIUM 9.3 06/25/2019 1512   CALCIUM 9.7 08/14/2017 1216   ALKPHOS 69 06/25/2019 1512   ALKPHOS 73 08/14/2017 1216   AST 21 06/25/2019 1512   ALT 21 06/25/2019 1512   ALT 18 08/14/2017 1216   BILITOT 0.4 06/25/2019 1512       Impression and Plan: Mackenzie Barber is a 78 year old white female with CLL.  She has had this for about 7 years.  She is doing quite well with this.  I do not see any evidence that the CLL is getting worse.  Everything really looks quite stable.  We will still plan to get her back in 1 year.   Volanda Napoleon, MD 8/14/20203:58 PM

## 2019-06-26 LAB — IGG, IGA, IGM
IgA: 132 mg/dL (ref 64–422)
IgG (Immunoglobin G), Serum: 856 mg/dL (ref 586–1602)
IgM (Immunoglobulin M), Srm: 14 mg/dL — ABNORMAL LOW (ref 26–217)

## 2019-07-06 ENCOUNTER — Other Ambulatory Visit: Payer: Self-pay | Admitting: Family Medicine

## 2019-07-06 DIAGNOSIS — I10 Essential (primary) hypertension: Secondary | ICD-10-CM

## 2019-07-06 MED ORDER — LOSARTAN POTASSIUM 100 MG PO TABS: 100.0000 mg | ORAL_TABLET | Freq: Every day | ORAL | 0 refills | Status: DC

## 2019-07-06 NOTE — Telephone Encounter (Signed)
Patient request refill on :   losartan (COZAAR) 100 MG tablet [[id number]]   Order Details Dose, Route, Frequency: As Directed  Dispense Quantity: 90 tablet Refills: 0 Fills remaining: --        Sig: TAKE 1 TABLET EVERY DAY  Patient taking differently: Take 100 mg by mouth daily.           --Forwarding request to medical asst that if approved to send refill order :  [place], [place] - [address] [phone] (Phone) [phone] (Fax)   --glh

## 2019-07-14 ENCOUNTER — Other Ambulatory Visit: Payer: Medicare PPO

## 2019-07-14 ENCOUNTER — Other Ambulatory Visit: Payer: Self-pay

## 2019-07-14 DIAGNOSIS — E1169 Type 2 diabetes mellitus with other specified complication: Secondary | ICD-10-CM

## 2019-07-14 DIAGNOSIS — E119 Type 2 diabetes mellitus without complications: Secondary | ICD-10-CM

## 2019-07-14 DIAGNOSIS — E1159 Type 2 diabetes mellitus with other circulatory complications: Secondary | ICD-10-CM

## 2019-07-14 DIAGNOSIS — E559 Vitamin D deficiency, unspecified: Secondary | ICD-10-CM

## 2019-07-14 DIAGNOSIS — I152 Hypertension secondary to endocrine disorders: Secondary | ICD-10-CM

## 2019-07-14 DIAGNOSIS — R5383 Other fatigue: Secondary | ICD-10-CM

## 2019-07-14 DIAGNOSIS — E538 Deficiency of other specified B group vitamins: Secondary | ICD-10-CM

## 2019-07-14 DIAGNOSIS — C9110 Chronic lymphocytic leukemia of B-cell type not having achieved remission: Secondary | ICD-10-CM

## 2019-08-02 ENCOUNTER — Ambulatory Visit: Payer: Medicare PPO | Admitting: Gastroenterology

## 2019-08-24 ENCOUNTER — Other Ambulatory Visit: Payer: Self-pay | Admitting: Family Medicine

## 2019-08-24 DIAGNOSIS — E119 Type 2 diabetes mellitus without complications: Secondary | ICD-10-CM

## 2019-09-03 ENCOUNTER — Other Ambulatory Visit: Payer: Self-pay

## 2019-09-03 ENCOUNTER — Other Ambulatory Visit (INDEPENDENT_AMBULATORY_CARE_PROVIDER_SITE_OTHER): Payer: Medicare PPO

## 2019-09-03 DIAGNOSIS — C9110 Chronic lymphocytic leukemia of B-cell type not having achieved remission: Secondary | ICD-10-CM

## 2019-09-03 DIAGNOSIS — E559 Vitamin D deficiency, unspecified: Secondary | ICD-10-CM

## 2019-09-03 DIAGNOSIS — E1169 Type 2 diabetes mellitus with other specified complication: Secondary | ICD-10-CM

## 2019-09-03 DIAGNOSIS — E785 Hyperlipidemia, unspecified: Secondary | ICD-10-CM

## 2019-09-03 DIAGNOSIS — E119 Type 2 diabetes mellitus without complications: Secondary | ICD-10-CM

## 2019-09-03 DIAGNOSIS — E1159 Type 2 diabetes mellitus with other circulatory complications: Secondary | ICD-10-CM | POA: Diagnosis not present

## 2019-09-03 DIAGNOSIS — Z23 Encounter for immunization: Secondary | ICD-10-CM

## 2019-09-03 DIAGNOSIS — R5383 Other fatigue: Secondary | ICD-10-CM

## 2019-09-03 DIAGNOSIS — E538 Deficiency of other specified B group vitamins: Secondary | ICD-10-CM | POA: Diagnosis not present

## 2019-09-03 DIAGNOSIS — I10 Essential (primary) hypertension: Secondary | ICD-10-CM

## 2019-09-03 NOTE — Addendum Note (Signed)
Addended by: Fonnie Mu on: 09/03/2019 09:50 AM   Modules accepted: Orders

## 2019-09-03 NOTE — Progress Notes (Signed)
Pt here for influenza vaccine.  Screening questionnaire reviewed, VIS provided to patient, and any/all patient questions answered.  T. Nelson, CMA  

## 2019-09-04 LAB — COMPREHENSIVE METABOLIC PANEL
ALT: 29 IU/L (ref 0–32)
AST: 26 IU/L (ref 0–40)
Albumin/Globulin Ratio: 1.9 (ref 1.2–2.2)
Albumin: 4.4 g/dL (ref 3.7–4.7)
Alkaline Phosphatase: 81 IU/L (ref 39–117)
BUN/Creatinine Ratio: 13 (ref 12–28)
BUN: 9 mg/dL (ref 8–27)
Bilirubin Total: 0.5 mg/dL (ref 0.0–1.2)
CO2: 24 mmol/L (ref 20–29)
Calcium: 9.8 mg/dL (ref 8.7–10.3)
Chloride: 95 mmol/L — ABNORMAL LOW (ref 96–106)
Creatinine, Ser: 0.72 mg/dL (ref 0.57–1.00)
GFR calc Af Amer: 94 mL/min/{1.73_m2} (ref >59)
GFR calc non Af Amer: 82 mL/min/{1.73_m2} (ref >59)
Globulin, Total: 2.3 g/dL (ref 1.5–4.5)
Glucose: 114 mg/dL — ABNORMAL HIGH (ref 65–99)
Potassium: 4.5 mmol/L (ref 3.5–5.2)
Sodium: 133 mmol/L — ABNORMAL LOW (ref 134–144)
Total Protein: 6.7 g/dL (ref 6.0–8.5)

## 2019-09-04 LAB — CBC WITH DIFFERENTIAL/PLATELET
Basophils Absolute: 0.2 10*3/uL (ref 0.0–0.2)
Basos: 1 %
EOS (ABSOLUTE): 0.3 10*3/uL (ref 0.0–0.4)
Eos: 1 %
Hematocrit: 40.9 % (ref 34.0–46.6)
Hemoglobin: 13.9 g/dL (ref 11.1–15.9)
Immature Grans (Abs): 0 10*3/uL (ref 0.0–0.1)
Immature Granulocytes: 0 %
Lymphocytes Absolute: 27.3 10*3/uL — ABNORMAL HIGH (ref 0.7–3.1)
Lymphs: 82 %
MCH: 29.8 pg (ref 26.6–33.0)
MCHC: 34 g/dL (ref 31.5–35.7)
MCV: 88 fL (ref 79–97)
Monocytes Absolute: 0.5 10*3/uL (ref 0.1–0.9)
Monocytes: 2 %
Neutrophils Absolute: 4.4 10*3/uL (ref 1.4–7.0)
Neutrophils: 14 %
Platelets: 233 10*3/uL (ref 150–450)
RBC: 4.66 x10E6/uL (ref 3.77–5.28)
RDW: 12.8 % (ref 11.7–15.4)
WBC: 32.7 10*3/uL (ref 3.4–10.8)

## 2019-09-04 LAB — HEMOGLOBIN A1C
Est. average glucose Bld gHb Est-mCnc: 140 mg/dL
Hgb A1c MFr Bld: 6.5 % — ABNORMAL HIGH (ref 4.8–5.6)

## 2019-09-04 LAB — VITAMIN D 25 HYDROXY (VIT D DEFICIENCY, FRACTURES): Vit D, 25-Hydroxy: 48.7 ng/mL (ref 30.0–100.0)

## 2019-09-04 LAB — T3: T3, Total: 156 ng/dL (ref 71–180)

## 2019-09-04 LAB — LIPID PANEL
Chol/HDL Ratio: 3.4 ratio (ref 0.0–4.4)
Cholesterol, Total: 161 mg/dL (ref 100–199)
HDL: 47 mg/dL (ref >39)
LDL Chol Calc (NIH): 82 mg/dL (ref 0–99)
Triglycerides: 189 mg/dL — ABNORMAL HIGH (ref 0–149)
VLDL Cholesterol Cal: 32 mg/dL (ref 5–40)

## 2019-09-04 LAB — VITAMIN B12: Vitamin B-12: 1120 pg/mL (ref 232–1245)

## 2019-09-04 LAB — TSH: TSH: 2.13 u[IU]/mL (ref 0.450–4.500)

## 2019-09-04 LAB — T4, FREE: Free T4: 1.38 ng/dL (ref 0.82–1.77)

## 2019-09-10 ENCOUNTER — Encounter: Payer: Self-pay | Admitting: Family Medicine

## 2019-09-10 ENCOUNTER — Other Ambulatory Visit: Payer: Self-pay

## 2019-09-10 ENCOUNTER — Ambulatory Visit (INDEPENDENT_AMBULATORY_CARE_PROVIDER_SITE_OTHER): Payer: Medicare PPO | Admitting: Family Medicine

## 2019-09-10 VITALS — BP 126/79 | HR 74 | Temp 97.7°F | Resp 12 | Ht 65.5 in | Wt 169.1 lb

## 2019-09-10 DIAGNOSIS — E1169 Type 2 diabetes mellitus with other specified complication: Secondary | ICD-10-CM | POA: Diagnosis not present

## 2019-09-10 DIAGNOSIS — E1159 Type 2 diabetes mellitus with other circulatory complications: Secondary | ICD-10-CM

## 2019-09-10 DIAGNOSIS — I10 Essential (primary) hypertension: Secondary | ICD-10-CM | POA: Diagnosis not present

## 2019-09-10 DIAGNOSIS — E785 Hyperlipidemia, unspecified: Secondary | ICD-10-CM

## 2019-09-10 DIAGNOSIS — E119 Type 2 diabetes mellitus without complications: Secondary | ICD-10-CM | POA: Diagnosis not present

## 2019-09-10 DIAGNOSIS — E559 Vitamin D deficiency, unspecified: Secondary | ICD-10-CM | POA: Diagnosis not present

## 2019-09-10 DIAGNOSIS — C9110 Chronic lymphocytic leukemia of B-cell type not having achieved remission: Secondary | ICD-10-CM

## 2019-09-10 MED ORDER — LOSARTAN POTASSIUM 100 MG PO TABS: 100.0000 mg | ORAL_TABLET | Freq: Every day | ORAL | 1 refills | Status: DC

## 2019-09-10 MED ORDER — CYANOCOBALAMIN 1000 MCG PO TABS: 1000.0000 ug | ORAL_TABLET | Freq: Every day | ORAL | 1 refills | Status: DC

## 2019-09-10 MED ORDER — METFORMIN HCL 500 MG PO TABS: ORAL_TABLET | ORAL | 1 refills | Status: DC

## 2019-09-10 MED ORDER — VITAMIN D (ERGOCALCIFEROL) 1.25 MG (50000 UNIT) PO CAPS: 50000.0000 [IU] | ORAL_CAPSULE | ORAL | 3 refills | Status: DC

## 2019-09-10 MED ORDER — ATORVASTATIN CALCIUM 20 MG PO TABS: 20.0000 mg | ORAL_TABLET | Freq: Every day | ORAL | 1 refills | Status: DC

## 2019-09-10 MED ORDER — HYDROCHLOROTHIAZIDE 25 MG PO TABS: 25.0000 mg | ORAL_TABLET | Freq: Every day | ORAL | 1 refills | Status: DC

## 2019-09-10 NOTE — Patient Instructions (Signed)
COVID-19: How to Protect Yourself and Others Know how it spreads  There is currently no vaccine to prevent coronavirus disease 2019 (COVID-19).  The best way to prevent illness is to avoid being exposed to this virus.  The virus is thought to spread mainly from person-to-person. ? Between people who are in close contact with one another (within about 6 feet). ? Through respiratory droplets produced when an infected person coughs, sneezes or talks. ? These droplets can land in the mouths or noses of people who are nearby or possibly be inhaled into the lungs. ? Some recent studies have suggested that COVID-19 may be spread by people who are not showing symptoms. Everyone should Clean your hands often  Wash your hands often with soap and water for at least 20 seconds especially after you have been in a public place, or after blowing your nose, coughing, or sneezing.  If soap and water are not readily available, use a hand sanitizer that contains at least 60% alcohol. Cover all surfaces of your hands and rub them together until they feel dry.  Avoid touching your eyes, nose, and mouth with unwashed hands. Avoid close contact  Stay home if you are sick.  Avoid close contact with people who are sick.  Put distance between yourself and other people. ? Remember that some people without symptoms may be able to spread virus. ? This is especially important for people who are at higher risk of getting very GainPain.com.cy Cover your mouth and nose with a cloth face cover when around others  You could spread COVID-19 to others even if you do not feel sick.  Everyone should wear a cloth face cover when they have to go out in public, for example to the grocery store or to pick up other necessities. ? Cloth face coverings should not be placed on young children under age 34, anyone who has trouble breathing, or is unconscious,  incapacitated or otherwise unable to remove the mask without assistance.  The cloth face cover is meant to protect other people in case you are infected.  Do NOT use a facemask meant for a Dietitian.  Continue to keep about 6 feet between yourself and others. The cloth face cover is not a substitute for social distancing. Cover coughs and sneezes  If you are in a private setting and do not have on your cloth face covering, remember to always cover your mouth and nose with a tissue when you cough or sneeze or use the inside of your elbow.  Throw used tissues in the trash.  Immediately wash your hands with soap and water for at least 20 seconds. If soap and water are not readily available, clean your hands with a hand sanitizer that contains at least 60% alcohol. Clean and disinfect  Clean AND disinfect frequently touched surfaces daily. This includes tables, doorknobs, light switches, countertops, handles, desks, phones, keyboards, toilets, faucets, and sinks. RackRewards.[email or web address]  If surfaces are dirty, clean them: Use detergent or soap and water prior to disinfection.  Then, use a household disinfectant. You can see a list of EPA-registered household disinfectants here. michellinders.com 03/16/2019 This information is not intended to replace advice given to you by your health care provider. Make sure you discuss any questions you have with your health care provider. Document Released: 02/23/2019 Document Revised: 03/24/2019 Document Reviewed: 02/23/2019 Elsevier Patient Education  2020 [employer].     COVID-19 COVID-19 is a respiratory infection that is caused by a virus called severe  acute respiratory syndrome coronavirus 2 (SARS-CoV-2). The disease is also known as coronavirus disease or novel coronavirus. In some people, the virus may not cause any symptoms. In others, it may cause a serious infection. The  infection can get worse quickly and can lead to complications, such as:  Pneumonia, or infection of the lungs.  Acute respiratory distress syndrome or ARDS. This is fluid build-up in the lungs.  Acute respiratory failure. This is a condition in which there is not enough oxygen passing from the lungs to the body.  Sepsis or septic shock. This is a serious bodily reaction to an infection.  Blood clotting problems.  Secondary infections due to bacteria or fungus. The virus that causes COVID-19 is contagious. This means that it can spread from person to person through droplets from coughs and sneezes (respiratory secretions). What are the causes? This illness is caused by a virus. You may catch the virus by:  Breathing in droplets from an infected person's cough or sneeze.  Touching something, like a table or a doorknob, that was exposed to the virus (contaminated) and then touching your mouth, nose, or eyes. What increases the risk? Risk for infection You are more likely to be infected with this virus if you:  Live in or travel to an area with a COVID-19 outbreak.  Come in contact with a sick person who recently traveled to an area with a COVID-19 outbreak.  Provide care for or live with a person who is infected with COVID-19. Risk for serious illness You are more likely to become seriously ill from the virus if you:  Are 40 years of age or older.  Have a long-term disease that lowers your body's ability to fight infection (immunocompromised).  Live in a nursing home or long-term care facility.  Have a long-term (chronic) disease such as: ? Chronic lung disease, including chronic obstructive pulmonary disease or asthma ? Heart disease. ? Diabetes. ? Chronic kidney disease. ? Liver disease.  Are obese. What are the signs or symptoms? Symptoms of this condition can range from mild to severe. Symptoms may appear any time from 2 to 14 days after being exposed to the virus.  They include:  A fever.  A cough.  Difficulty breathing.  Chills.  Muscle pains.  A sore throat.  Loss of taste or smell. Some people may also have stomach problems, such as nausea, vomiting, or diarrhea. Other people may not have any symptoms of COVID-19. How is this diagnosed? This condition may be diagnosed based on:  Your signs and symptoms, especially if: ? You live in an area with a COVID-19 outbreak. ? You recently traveled to or from an area where the virus is common. ? You provide care for or live with a person who was diagnosed with COVID-19.  A physical exam.  Lab tests, which may include: ? A nasal swab to take a sample of fluid from your nose. ? A throat swab to take a sample of fluid from your throat. ? A sample of mucus from your lungs (sputum). ? Blood tests.  Imaging tests, which may include, X-rays, CT scan, or ultrasound. How is this treated? At present, there is no medicine to treat COVID-19. Medicines that treat other diseases are being used on a trial basis to see if they are effective against COVID-19. Your health care provider will talk with you about ways to treat your symptoms. For most people, the infection is mild and can be managed at home with rest,  fluids, and over-the-counter medicines. Treatment for a serious infection usually takes places in a hospital intensive care unit (ICU). It may include one or more of the following treatments. These treatments are given until your symptoms improve.  Receiving fluids and medicines through an IV.  Supplemental oxygen. Extra oxygen is given through a tube in the nose, a face mask, or a hood.  Positioning you to lie on your stomach (prone position). This makes it easier for oxygen to get into the lungs.  Continuous positive airway pressure (CPAP) or bi-level positive airway pressure (BPAP) machine. This treatment uses mild air pressure to keep the airways open. A tube that is connected to a motor  delivers oxygen to the body.  Ventilator. This treatment moves air into and out of the lungs by using a tube that is placed in your windpipe.  Tracheostomy. This is a procedure to create a hole in the neck so that a breathing tube can be inserted.  Extracorporeal membrane oxygenation (ECMO). This procedure gives the lungs a chance to recover by taking over the functions of the heart and lungs. It supplies oxygen to the body and removes carbon dioxide. Follow these instructions at home: Lifestyle  If you are sick, stay home except to get medical care. Your health care provider will tell you how long to stay home. Call your health care provider before you go for medical care.  Rest at home as told by your health care provider.  Do not use any products that contain nicotine or tobacco, such as cigarettes, e-cigarettes, and chewing tobacco. If you need help quitting, ask your health care provider.  Return to your normal activities as told by your health care provider. Ask your health care provider what activities are safe for you. General instructions  Take over-the-counter and prescription medicines only as told by your health care provider.  Drink enough fluid to keep your urine pale yellow.  Keep all follow-up visits as told by your health care provider. This is important. How is this prevented?  There is no vaccine to help prevent COVID-19 infection. However, there are steps you can take to protect yourself and others from this virus. To protect yourself:   Do not travel to areas where COVID-19 is a risk. The areas where COVID-19 is reported change often. To identify high-risk areas and travel restrictions, check the CDC travel website: FatFares.com.br  If you live in, or must travel to, an area where COVID-19 is a risk, take precautions to avoid infection. ? Stay away from people who are sick. ? Wash your hands often with soap and water for 20 seconds. If soap and water  are not available, use an alcohol-based hand sanitizer. ? Avoid touching your mouth, face, eyes, or nose. ? Avoid going out in public, follow guidance from your state and local health authorities. ? If you must go out in public, wear a cloth face covering or face mask. ? Disinfect objects and surfaces that are frequently touched every day. This may include:  Counters and tables.  Doorknobs and light switches.  Sinks and faucets.  Electronics, such as phones, remote controls, keyboards, computers, and tablets. To protect others: If you have symptoms of COVID-19, take steps to prevent the virus from spreading to others.  If you think you have a COVID-19 infection, contact your health care provider right away. Tell your health care team that you think you may have a COVID-19 infection.  Stay home. Leave your house only to seek  medical care. Do not use public transport.  Do not travel while you are sick.  Wash your hands often with soap and water for 20 seconds. If soap and water are not available, use alcohol-based hand sanitizer.  Stay away from other members of your household. Let healthy household members care for children and pets, if possible. If you have to care for children or pets, wash your hands often and wear a mask. If possible, stay in your own room, separate from others. Use a different bathroom.  Make sure that all people in your household wash their hands well and often.  Cough or sneeze into a tissue or your sleeve or elbow. Do not cough or sneeze into your hand or into the air.  Wear a cloth face covering or face mask. Where to find more information  Centers for Disease Control and Prevention: PurpleGadgets.be  World Health Organization: https://www.castaneda.info/ Contact a health care provider if:  You live in or have traveled to an area where COVID-19 is a risk and you have symptoms of the infection.  You have had contact  with someone who has COVID-19 and you have symptoms of the infection. Get help right away if:  You have trouble breathing.  You have pain or pressure in your chest.  You have confusion.  You have bluish lips and fingernails.  You have difficulty waking from sleep.  You have symptoms that get worse. These symptoms may represent a serious problem that is an emergency. Do not wait to see if the symptoms will go away. Get medical help right away. Call your local emergency services (911 in the [place] Do not drive yourself to the hospital. Let the emergency medical personnel know if you think you have COVID-19. Summary  COVID-19 is a respiratory infection that is caused by a virus. It is also known as coronavirus disease or novel coronavirus. It can cause serious infections, such as pneumonia, acute respiratory distress syndrome, acute respiratory failure, or sepsis.  The virus that causes COVID-19 is contagious. This means that it can spread from person to person through droplets from coughs and sneezes.  You are more likely to develop a serious illness if you are 62 years of age or older, have a weak immunity, live in a nursing home, or have chronic disease.  There is no medicine to treat COVID-19. Your health care provider will talk with you about ways to treat your symptoms.  Take steps to protect yourself and others from infection. Wash your hands often and disinfect objects and surfaces that are frequently touched every day. Stay away from people who are sick and wear a mask if you are sick. This information is not intended to replace advice given to you by your health care provider. Make sure you discuss any questions you have with your health care provider. Document Released: 12/03/2018 Document Revised: 03/25/2019 Document Reviewed: 12/03/2018 Elsevier Patient Education  2020 [employer].     Diabetes Mellitus and Standards of Medical Care  Managing diabetes (diabetes  mellitus) can be complicated. Your diabetes treatment may be managed by a team of health care providers, including:  A diet and nutrition specialist (registered dietitian).  A nurse.  A certified diabetes educator (CDE).  A diabetes specialist (endocrinologist).  An eye doctor.  A primary care provider.  A dentist.  Your health care providers follow a schedule in order to help you get the best quality of care. The following schedule is a general guideline for your diabetes  management plan. Your health care providers may also give you more specific instructions.  HbA1c (hemoglobin A1c) test This test provides information about blood sugar (glucose) control over the previous 2-3 months. It is used to check whether your diabetes management plan needs to be adjusted.  If you are meeting your treatment goals, this test is done at least 2 times a year.  If you are not meeting treatment goals or if your treatment goals have changed, this test is done 4 times a year.  Blood pressure test  This test is done at every routine medical visit. For most people, the goal is less than 130/80. Ask your health care provider what your goal blood pressure should be.  Dental and eye exams  Visit your dentist two times a year.  If you have type 1 diabetes, get an eye exam 3-5 years after you are diagnosed, and then once a year after your first exam. ? If you were diagnosed with type 1 diabetes as a child, get an eye exam when you are age 44 or older and have had diabetes for 3-5 years. After the first exam, you should get an eye exam once a year.  If you have type 2 diabetes, have an eye exam as soon as you are diagnosed, and then once a year after your first exam.  Foot care exam  Visual foot exams are done at every routine medical visit. The exams check for cuts, bruises, redness, blisters, sores, or other problems with the feet.  A complete foot exam is done by your health care provider once a  year. This exam includes an inspection of the structure and skin of your feet, and a check of the pulses and sensation in your feet. ? Type 1 diabetes: Get your first exam 3-5 years after diagnosis. ? Type 2 diabetes: Get your first exam as soon as you are diagnosed.  Check your feet every day for cuts, bruises, redness, blisters, or sores. If you have any of these or other problems that are not healing, contact your health care provider.  Kidney function test (urine microalbumin)  This test is done once a year. ? Type 1 diabetes: Get your first test 5 years after diagnosis. ? Type 2 diabetes: Get your first test as soon as you are diagnosed._  If you have chronic kidney disease (CKD), get a serum creatinine and estimated glomerular filtration rate (eGFR) test once a year.  Lipid profile (cholesterol, HDL, LDL, triglycerides)  This test should be done when you are diagnosed with diabetes, and every 5 years after the first test. If you are on medicines to lower your cholesterol, you may need to get this test done every year. ? The goal for LDL is less than 100 mg/dL (5.5 mmol/L). If you are at high risk, the goal is less than 70 mg/dL (3.9 mmol/L). ? The goal for HDL is 40 mg/dL (2.2 mmol/L) for men and 50 mg/dL(2.8 mmol/L) for women. An HDL cholesterol of 60 mg/dL (3.3 mmol/L) or higher gives some protection against heart disease. ? The goal for triglycerides is less than 150 mg/dL (8.3 mmol/L).  Immunizations  The yearly flu (influenza) vaccine is recommended for everyone 6 months or older who has diabetes.  The pneumonia (pneumococcal) vaccine is recommended for everyone 2 years or older who has diabetes. If you are 31 or older, you may get the pneumonia vaccine as a series of two separate shots.  The hepatitis B vaccine is recommended for  adults shortly after they have been diagnosed with diabetes.  The Tdap (tetanus, diphtheria, and pertussis) vaccine should be given: ? According to  normal childhood vaccination schedules, for children. ? Every 10 years, for adults who have diabetes.  The shingles vaccine is recommended for people who have had chicken pox and are 50 years or older.  Mental and emotional health  Screening for symptoms of eating disorders, anxiety, and depression is recommended at the time of diagnosis and afterward as needed. If your screening shows that you have symptoms (you have a positive screening result), you may need further evaluation and be referred to a mental health care provider.  Diabetes self-management education  Education about how to manage your diabetes is recommended at diagnosis and ongoing as needed.  Treatment plan  Your treatment plan will be reviewed at every medical visit.  Summary  Managing diabetes (diabetes mellitus) can be complicated. Your diabetes treatment may be managed by a team of health care providers.  Your health care providers follow a schedule in order to help you get the best quality of care.  Standards of care including having regular physical exams, blood tests, blood pressure monitoring, immunizations, screening tests, and education about how to manage your diabetes.  Your health care providers may also give you more specific instructions based on your individual health.      Type 2 Diabetes Mellitus, Self Care, Adult Caring for yourself after you have been diagnosed with type 2 diabetes (type 2 diabetes mellitus) means keeping your blood sugar (glucose) under control with a balance of:  Nutrition.  Exercise.  Lifestyle changes.  Medicines or insulin, if necessary.  Support from your team of health care providers and others.  The following information explains what you need to know to manage your diabetes at home. What do I need to do to manage my blood glucose?  Check your blood glucose every day, as often as told by your health care provider.  Contact your health care provider if your  blood glucose is above your target for 2 tests in a row.  Have your A1c (hemoglobin A1c) level checked at least two times a year, or as often as told by your health care provider. Your health care provider will set individualized treatment goals for you. Generally, the goal of treatment is to maintain the following blood glucose levels:  Before meals (preprandial): 80-130 mg/dL (4.4-7.2 mmol/L).  After meals (postprandial): below 180 mg/dL (10 mmol/L).  A1c level: less than 7%.  What do I need to know about hyperglycemia and hypoglycemia? What is hyperglycemia? Hyperglycemia, also called high blood glucose, occurs when blood glucose is too high.Make sure you know the early signs of hyperglycemia, such as:  Increased thirst.  Hunger.  Feeling very tired.  Needing to urinate more often than usual.  Blurry vision.  What is hypoglycemia? Hypoglycemia, also called low blood glucose, occurswith a blood glucose level at or below 70 mg/dL (3.9 mmol/L). The risk for hypoglycemia increases during or after exercise, during sleep, during illness, and when skipping meals or not eating for a long time (fasting). It is important to know the symptoms of hypoglycemia and treat it right away. Always have a 15-gram rapid-acting carbohydrate snack with you to treat low blood glucose. Family members and close friends should also know the symptoms and should understand how to treat hypoglycemia, in case you are not able to treat yourself. What are the symptoms of hypoglycemia? Hypoglycemia symptoms can include:  Hunger.  Anxiety.  Sweating and feeling clammy.  Confusion.  Dizziness or feeling light-headed.  Sleepiness.  Nausea.  Increased heart rate.  Headache.  Blurry vision.  Seizure.  Nightmares.  Tingling or numbness around the mouth, lips, or tongue.  A change in speech.  Decreased ability to concentrate.  A change in coordination.  Restless sleep.  Tremors or  shakes.  Fainting.  Irritability.  How do I treat hypoglycemia?  If you are alert and able to swallow safely, follow the 15:15 rule:  Take 15 grams of a rapid-acting carbohydrate. Rapid-acting options include: ? 1 tube of glucose gel. ? 3 glucose pills. ? 6-8 pieces of hard candy. ? 4 oz (120 mL) of fruit juice. ? 4 oz (120 mL) of regular (not diet) soda.  Check your blood glucose 15 minutes after you take the carbohydrate.  If the repeat blood glucose level is still at or below 70 mg/dL (3.9 mmol/L), take 15 grams of a carbohydrate again.  If your blood glucose level does not increase above 70 mg/dL (3.9 mmol/L) after 3 tries, seek emergency medical care.  After your blood glucose level returns to normal, eat a meal or a snack within 1 hour.  How do I treat severe hypoglycemia? Severe hypoglycemia is when your blood glucose level is at or below 54 mg/dL (3 mmol/L). Severe hypoglycemia is an emergency. Do not wait to see if the symptoms will go away. Get medical help right away. Call your local emergency services (911 in the [place] Do not drive yourself to the hospital. If you have severe hypoglycemia and you cannot eat or drink, you may need an injection of glucagon. A family member or close friend should learn how to check your blood glucose and how to give you a glucagon injection. Ask your health care provider if you need to have an emergency glucagon injection kit available. Severe hypoglycemia may need to be treated in a hospital. The treatment may include getting glucose through an IV tube. You may also need treatment for the cause of your hypoglycemia. Can having diabetes put me at risk for other conditions? Having diabetes can put you at risk for other long-term (chronic) conditions, such as heart disease and kidney disease. Your health care provider may prescribe medicines to help prevent complications from diabetes. These medicines may include:  Aspirin.  Medicine to  lower cholesterol.  Medicine to control blood pressure.  What else can I do to manage my diabetes? Take your diabetes medicines as told  If your health care provider prescribed insulin or diabetes medicines, take them every day.  Do not run out of insulin or other diabetes medicines that you take. Plan ahead so you always have these available.  If you use insulin, adjust your dosage based on how physically active you are and what foods you eat. Your health care provider will tell you how to adjust your dosage. Make healthy food choices  The things that you eat and drink affect your blood glucose and your insulin dosage. Making good choices helps to control your diabetes and prevent other health problems. A healthy meal plan includes eating lean proteins, complex carbohydrates, fresh fruits and vegetables, low-fat dairy products, and healthy fats. Make an appointment to see a diet and nutrition specialist (registered dietitian) to help you create an eating plan that is right for you. Make sure that you:  Follow instructions from your health care provider about eating or drinking restrictions.  Drink enough fluid to keep your urine  clear or pale yellow.  Eat healthy snacks between nutritious meals.  Track the carbohydrates that you eat. Do this by reading food labels and learning the standard serving sizes of foods.  Follow your sick day plan whenever you cannot eat or drink as usual. Make this plan in advance with your health care provider.  Stay active  Exercise regularly, as told by your health care provider. This may include:  Stretching and doing strength exercises, such as yoga or weightlifting, at least 2 times a week.  Doing at least 150 minutes of moderate-intensity or vigorous-intensity exercise each week. This could be brisk walking, biking, or water aerobics. ? Spread out your activity over at least 3 days of the week. ? Do not go more than 2 days in a row without doing  some kind of physical activity.  When you start a new exercise or activity, work with your health care provider to adjust your insulin, medicines, or food intake as needed. Make healthy lifestyle choices  Do not use any tobacco products, such as cigarettes, chewing tobacco, and e-cigarettes. If you need help quitting, ask your health care provider.  If your health care provider says that alcohol is safe for you, limit alcohol intake to no more than 1 drink per day for nonpregnant women and 2 drinks per day for men. One drink equals 12 oz of beer, 5 oz of wine, or 1 oz of hard liquor.  Learn to manage stress. If you need help with this, ask your health care provider. Care for your body   Keep your immunizations up to date. In addition to getting vaccinations as told by your health care provider, it is recommended that you get vaccinated against the following illnesses: ? The flu (influenza). Get a flu shot every year. ? Pneumonia. ? Hepatitis B.  Schedule an eye exam soon after your diagnosis, and then one time every year after that.  Check your skin and feet every day for cuts, bruises, redness, blisters, or sores. Schedule a foot exam with your health care provider once every year.  Brush your teeth and gums two times a day, and floss at least one time a day. Visit your dentist at least once every 6 months.  Maintain a healthy weight. General instructions  Take over-the-counter and prescription medicines only as told by your health care provider.  Share your diabetes management plan with people in your workplace, school, and household.  Check your urine for ketones when you are ill and as told by your health care provider.  Ask your health care provider: ? Do I need to meet with a diabetes educator? ? Where can I find a support group for people with diabetes?  Carry a medical alert card or wear medical alert jewelry.  Keep all follow-up visits as told by your health care  provider. This is important. Where to find more information: For more information about diabetes, visit:  American Diabetes Association (ADA): www.diabetes.org  American Association of Diabetes Educators (AADE): www.diabeteseducator.org/patient-resources  This information is not intended to replace advice given to you by your health care provider. Make sure you discuss any questions you have with your health care provider. Document Released: 02/19/2016 Document Revised: 04/04/2016 Document Reviewed: 12/01/2015 Elsevier Interactive Patient Education  2017 [place].      Blood Glucose Monitoring, Adult Monitoring your blood sugar (glucose) helps you manage your diabetes. It also helps you and your health care provider determine how well your diabetes management plan is  working. Blood glucose monitoring involves checking your blood glucose as often as directed, and keeping a record (log) of your results over time. Why should I monitor my blood glucose? Checking your blood glucose regularly can:  Help you understand how food, exercise, illnesses, and medicines affect your blood glucose.  Let you know what your blood glucose is at any time. You can quickly tell if you are having low blood glucose (hypoglycemia) or high blood glucose (hyperglycemia).  Help you and your health care provider adjust your medicines as needed.  When should I check my blood glucose? Follow instructions from your health care provider about how often to check your blood glucose.   This may depend on:  The type of diabetes you have.  How well-controlled your diabetes is.  Medicines you are taking.  If you have type 1 diabetes:  Check your blood glucose at least 2 times a day.  Also check your blood glucose: ? Before every insulin injection. ? Before and after exercise. ? Between meals. ? 2 hours after a meal. ? Occasionally between 2:00 a.m. and 3:00 a.m., as directed. ? Before potentially  dangerous tasks, like driving or using heavy machinery. ? At bedtime.  You may need to check your blood glucose more often, up to 6-10 times a day: ? If you use an insulin pump. ? If you need multiple daily injections (MDI). ? If your diabetes is not well-controlled. ? If you are ill. ? If you have a history of severe hypoglycemia. ? If you have a history of not knowing when your blood glucose is getting low (hypoglycemia unawareness).  If you have type 2 diabetes:  If you take insulin or other diabetes medicines, check your blood glucose at least 2 times a day.  If you are on intensive insulin therapy, check your blood glucose at least 4 times a day. Occasionally, you may also need to check between 2:00 a.m. and 3:00 a.m., as directed.  Also check your blood glucose: ? Before and after exercise. ? Before potentially dangerous tasks, like driving or using heavy machinery.  You may need to check your blood glucose more often if: ? Your medicine is being adjusted. ? Your diabetes is not well-controlled. ? You are ill.  What is a blood glucose log?  A blood glucose log is a record of your blood glucose readings. It helps you and your health care provider: ? Look for patterns in your blood glucose over time. ? Adjust your diabetes management plan as needed.  Every time you check your blood glucose, write down your result and notes about things that may be affecting your blood glucose, such as your diet and exercise for the day.  Most glucose meters store a record of glucose readings in the meter. Some meters allow you to download your records to a computer. How do I check my blood glucose? Follow these steps to get accurate readings of your blood glucose: Supplies needed   Blood glucose meter.  Test strips for your meter. Each meter has its own strips. You must use the strips that come with your meter.  A needle to prick your finger (lancet). Do not use lancets more than  once.  A device that holds the lancet (lancing device).  A journal or log book to write down your results.  Procedure  Wash your hands with soap and water.  Prick the side of your finger (not the tip) with the lancet. Use a different finger each  time.  Gently rub the finger until a small drop of blood appears.  Follow instructions that come with your meter for inserting the test strip, applying blood to the strip, and using your blood glucose meter.  Write down your result and any notes.  Alternative testing sites  Some meters allow you to use areas of your body other than your finger (alternative sites) to test your blood.  If you think you may have hypoglycemia, or if you have hypoglycemia unawareness, do not use alternative sites. Use your finger instead.  Alternative sites may not be as accurate as the fingers, because blood flow is slower in these areas. This means that the result you get may be delayed, and it may be different from the result that you would get from your finger.  The most common alternative sites are: ? Forearm. ? Thigh. ? Palm of the hand.  Additional tips  Always keep your supplies with you.  If you have questions or need help, all blood glucose meters have a 24-hour hotline number that you can call. You may also contact your health care provider.  After you use a few boxes of test strips, adjust (calibrate) your blood glucose meter by following instructions that came with your meter.    The American Diabetes Association suggests the following targets for most nonpregnant adults with diabetes.  More or less stringent glycemic goals may be appropriate for each individual.  A1C: Less than 7% A1C may also be reported as eAG: Less than 154 mg/dl Before a meal (preprandial plasma glucose): 80-130 mg/dl 1-2 hours after beginning of the meal (Postprandial plasma glucose)*: Less than 180 mg/dl  *Postprandial glucose may be targeted if A1C goals are  not met despite reaching preprandial glucose goals.   GOALS in short:  The goals are for the Hgb A1C to be less than 7.0 & blood pressure to be less than 130/80.    It is recommended that all diabetics are educated on and follow a healthy diabetic diet, exercise for 30 minutes 3-4 times per week (walking, biking, swimming, or machine), monitor blood glucose readings and bring that record with you to be reviewed at your next office visit.     You should be checking fasting blood sugars- especially after you eat poorly or eat really healthy, and also check 2 hour postprandial blood sugars after largest meal of the day.    Write these down and bring in your log at each office visit.    You will need to be seen every 3 months by the provider managing your Diabetes unless told otherwise by that provider.   You will need yearly eye exams from an eye specialist and foot exams to check the nerves of your feet.  Also, your urine should be checked yearly as well to make sure excess protein is not present.   If you are checking your blood pressure at home, please record it and bring it to your next office visit.    Follow the Dietary Approaches to Stop Hypertension (DASH) diet (3 servings of fruit and vegetables daily, whole grains, low sodium, low-fat proteins).  See below.    Lastly, when it comes to your cholesterol, the goal is to have the HDL (good cholesterol) >40, and the LDL (bad cholesterol) <100.   It is recommended that you follow a heart healthy, low saturated and trans-fat diet and exercise for 30 minutes at least 5 times a week.     (( Check out  the DASH diet = 1.5 Gram Low Sodium Diet   A 1.5 gram sodium diet restricts the amount of sodium in the diet to no more than 1.5 g or 1500 mg daily.  The American Heart Association recommends Americans over the age of 105 to consume no more than 1500 mg of sodium each day to reduce the risk of developing high blood pressure.  Research also shows that  limiting sodium may reduce heart attack and stroke risk.  Many foods contain sodium for flavor and sometimes as a preservative.  When the amount of sodium in a diet needs to be low, it is important to know what to look for when choosing foods and drinks.  The following includes some information and guidelines to help make it easier for you to adapt to a low sodium diet.    QUICK TIPS  Do not add salt to food.  Avoid convenience items and fast food.  Choose unsalted snack foods.  Buy lower sodium products, often labeled as "lower sodium" or "no salt added."  Check food labels to learn how much sodium is in 1 serving.  When eating at a restaurant, ask that your food be prepared with less salt or none, if possible.    READING FOOD LABELS FOR SODIUM INFORMATION  The nutrition facts label is a good place to find how much sodium is in foods. Look for products with no more than 400 mg of sodium per serving.  Remember that 1.5 g = 1500 mg.  The food label may also list foods as:  Sodium-free: Less than 5 mg in a serving.  Very low sodium: 35 mg or less in a serving.  Low-sodium: 140 mg or less in a serving.  Light in sodium: 50% less sodium in a serving. For example, if a food that usually has 300 mg of sodium is changed to become light in sodium, it will have 150 mg of sodium.  Reduced sodium: 25% less sodium in a serving. For example, if a food that usually has 400 mg of sodium is changed to reduced sodium, it will have 300 mg of sodium.    CHOOSING FOODS  Grains  Avoid: Salted crackers and snack items. Some cereals, including instant hot cereals. Bread stuffing and biscuit mixes. Seasoned rice or pasta mixes.  Choose: Unsalted snack items. Low-sodium cereals, oats, puffed wheat and rice, shredded wheat. English muffins and bread. Pasta.  Meats  Avoid: Salted, canned, smoked, spiced, pickled meats, including fish and poultry. Bacon, ham, sausage, cold cuts, hot dogs, anchovies.  Choose:  Low-sodium canned tuna and salmon. Fresh or frozen meat, poultry, and fish.  Dairy  Avoid: Processed cheese and spreads. Cottage cheese. Buttermilk and condensed milk. Regular cheese.  Choose: Milk. Low-sodium cottage cheese. Yogurt. Sour cream. Low-sodium cheese.  Fruits and Vegetables  Avoid: Regular canned vegetables. Regular canned tomato sauce and paste. Frozen vegetables in sauces. Olives. Angie Fava. Relishes. Sauerkraut.  Choose: Low-sodium canned vegetables. Low-sodium tomato sauce and paste. Frozen or fresh vegetables. Fresh and frozen fruit.  Condiments  Avoid: Canned and packaged gravies. Worcestershire sauce. Tartar sauce. Barbecue sauce. Soy sauce. Steak sauce. Ketchup. Onion, garlic, and table salt. Meat flavorings and tenderizers.  Choose: Fresh and dried herbs and spices. Low-sodium varieties of mustard and ketchup. Lemon juice. Tabasco sauce. Horseradish.    SAMPLE 1.5 GRAM SODIUM MEAL PLAN:   Breakfast / Sodium (mg)  1 cup low-fat milk / 143 mg  1 whole-wheat English muffin / 240 mg  1  tbs heart-healthy margarine / 153 mg  1 hard-boiled egg / 139 mg  1 small orange / 0 mg  Lunch / Sodium (mg)  1 cup raw carrots / 76 mg  2 tbs no salt added peanut butter / 5 mg  2 slices whole-wheat bread / 270 mg  1 tbs jelly / 6 mg   cup red grapes / 2 mg  Dinner / Sodium (mg)  1 cup whole-wheat pasta / 2 mg  1 cup low-sodium tomato sauce / 73 mg  3 oz lean ground beef / 57 mg  1 small side salad (1 cup raw spinach leaves,  cup cucumber,  cup yellow bell pepper) with 1 tsp olive oil and 1 tsp red wine vinegar / 25 mg  Snack / Sodium (mg)  1 container low-fat vanilla yogurt / 107 mg  3 graham cracker squares / 127 mg  Nutrient Analysis  Calories: 1745  Protein: 75 g  Carbohydrate: 237 g  Fat: 57 g  Sodium: 1425 mg  Document Released: 10/28/2005 Document Revised: 07/10/2011 Document Reviewed: 01/29/2010  ExitCare Patient Information 2012 [place], [facility].))    This  information is not intended to replace advice given to you by your health care provider. Make sure you discuss any questions you have with your health care provider. Document Released: 10/31/2003 Document Revised: 05/17/2016 Document Reviewed: 04/08/2016 Elsevier Interactive Patient Education  2017 [employer].

## 2019-09-10 NOTE — Progress Notes (Signed)
Assessment and plan:  1. Diabetes mellitus without complication ([place])   2. Hypertension associated with diabetes ([place])   3. Hyperlipidemia associated with type 2 diabetes mellitus ([place])   4. CLL (chronic lymphocytic leukemia) ([place])   5. Vitamin D insufficiency   6. Hypertension, unspecified type     - Reviewed recent lab work (09/03/2019) in depth with patient today.  All lab work within normal limits unless otherwise noted.  Extensive education provided and all questions answered.  - Discussed importance of controlling blood sugar and blood pressure to maintain organ health.   Diabetes Mellitus - HbA1c at 6.5 last check, well controlled - Continue management as established.  See med list. - Ongoing blood sugar monitoring encouraged. - Patient knows to check fasting blood sugars and 2 hours after her largest meal. - Prudent, high protein, low carb dietary habits encouraged.  - Will continue to monitor.   Hypertension Associated with DM - Stable at this time. - Continue management as established.  See med list. - Ongoing ambulatory BP monitoring encouraged. - Will continue to monitor.   Hyperlipidemia Associated with DM - Managed on statin.  Triglycerides = 189 last check, elevated. HDL = 47, WNL. LDL = 82, WNL, up from 63 last year.  - Advised that in a patient with diabetes, goal LDL is less than 70. - Increasing dose of statin today, from 10 to 20.  See med list. - Otherwise, dietary changes such as low saturated & trans fat diets for hyperlipidemia and low carb diets for hypertriglyceridemia discussed with patient.    - To help increase HDL and decrease LDL, encouraged patient to follow AHA guidelines for regular exercise and engage in weight loss if BMI above 25.   - Educational handouts provided at patient's desire and/ or told to look online at the [facility] website for further  information.  - We will continue to monitor.   Chronic Lymphocytic Leukemia - Discussed that patient's blood counts are stable per her baseline. - Encouraged patient to continue to follow up with hematology/oncology as established. - Will continue to monitor and re-check blood work as recommended.   B12 - B12 last checked at 1,120, WNL at this time. - Advised patient to continue supplementation as prescribed. - Will continue to monitor and re-check as recommended.   Vitamin D Insufficiency - Managed on supplementation as prescribed. - Patient tolerating once-weekly dose well. - Vitamin D last at 48.7, WNL. - Reviewed goal range of 40-60. - Continue treatment plan as established. - Will continue to monitor and-recheck as recommended.   BMI Counseling - Body mass index is 27.71 kg/m Explained to patient what BMI refers to, and what it means medically.    Told patient to think about it as a "medical risk stratification measurement" and how increasing BMI is associated with increasing risk/ or worsening state of various diseases such as hypertension, hyperlipidemia, diabetes, premature OA, depression etc.  American Heart Association guidelines for healthy diet, basically Mediterranean diet, and exercise guidelines of 30 minutes 5 days per week or more discussed in detail.  Health counseling performed.  All questions answered.  Lifestyle & Preventative Health Maintenance - Advised patient to continue working toward exercising to improve overall mental, physical, and emotional health.    - Reviewed the "spokes of the wheel" of mood and health management.  Stressed the importance of ongoing prudent habits, including regular exercise, appropriate sleep hygiene, healthful dietary habits, and prayer/meditation to  relax.  - Encouraged patient to engage in daily physical activity, especially a formal exercise routine.  Recommended that the patient eventually strive for at least 150 minutes  of moderate cardiovascular activity per week according to guidelines established by the [facility].   - Healthy dietary habits encouraged, including low-carb, and high amounts of lean protein in diet.   - Patient should also consume adequate amounts of water.   Education and routine counseling performed. Handouts provided.  - Novel Covid -19 counseling done; all questions were answered.   - Current CDC / federal and [facility] guidelines reviewed with patient  - Reminded pt of extreme importance of social distancing; wearing a mask when out in public; insensate handwashing and cleaning of surfaces, avoiding unnecessary trips for shopping and avoiding ALL but emergency appts etc. - Told patient to be prepared, not scared; and be smart for the sake of others - Patient will call with any additional concerns  Recommendations - Re-check FLP and ALT in 2-3 months. - Discussed need for Medicare Wellness in near future.    Orders Placed This Encounter  Procedures  . ALT  . Lipid panel    Meds ordered this encounter  Medications  . Vitamin D, Ergocalciferol, (DRISDOL) 1.25 MG (50000 UT) CAPS capsule    Sig: Take 1 capsule (50,000 Units total) by mouth once a week. Swallow whole.  Do NOT chew or crush. (either Sundays or Mondays)    Dispense:  12 capsule    Refill:  3  . cyanocobalamin 1000 MCG tablet    Sig: Take 1 tablet (1,000 mcg total) by mouth daily.    Dispense:  90 tablet    Refill:  1  . hydrochlorothiazide (HYDRODIURIL) 25 MG tablet    Sig: Take 1 tablet (25 mg total) by mouth daily.    Dispense:  90 tablet    Refill:  1  . losartan (COZAAR) 100 MG tablet    Sig: Take 1 tablet (100 mg total) by mouth daily.    Dispense:  90 tablet    Refill:  1  . metFORMIN (GLUCOPHAGE) 500 MG tablet    Sig: TAKE 1/2 TABLET TWICE DAILY WITH A MEAL    Dispense:  90 tablet    Refill:  1  . atorvastatin (LIPITOR) 20 MG tablet    Sig: Take 1 tablet (20 mg total) by mouth daily.    Dispense:   90 tablet    Refill:  1    Medications Discontinued During This Encounter  Medication Reason  . hydrocortisone (ANUSOL-HC) 25 MG suppository Error  . Vitamin D, Ergocalciferol, (DRISDOL) 1.25 MG (50000 UT) CAPS capsule Reorder  . atorvastatin (LIPITOR) 10 MG tablet Dose change  . cyanocobalamin 1000 MCG tablet Reorder  . hydrochlorothiazide (HYDRODIURIL) 25 MG tablet Reorder  . metFORMIN (GLUCOPHAGE) 500 MG tablet Reorder  . losartan (COZAAR) 100 MG tablet Reorder      Return for 6wk-lab only FLP and ALT-which would be 3 days prior to medicare wellness OV in 6.5 wks.   Anticipatory guidance and routine counseling done re: condition, txmnt options and need for follow up. All questions of patient's were answered.   Gross side effects, risk and benefits, and alternatives of medications discussed with patient.  Patient is aware that all medications have potential side effects and we are unable to predict every sideeffect or drug-drug interaction that may occur.  Expresses verbal understanding and consents to current therapy plan and treatment regiment.  Please see AVS handed  out to patient at the end of our visit for additional patient instructions/ counseling done pertaining to today's office visit.  Note:  This document was prepared using Dragon voice recognition software and may include unintentional dictation errors.  This document serves as a record of services personally performed by Mellody Dance, DO. It was created on her behalf by Toni Amend, a trained medical scribe. The creation of this record is based on the scribe's personal observations and the provider's statements to them.   This case required medical decision making of at least moderate complexity. The above documentation has been reviewed to be accurate and was completed by Marjory Sneddon, D.O.     ----------------------------------------------------------------------------------------------------------------------  Subjective:   Phillips Odor, am serving as scribe for Dr. Mellody Dance.  CC:   RAI SINAGRA is a 78 y.o. female who presents to [place] [facility] today for review and discussion of recent bloodwork that was done in addition to f/up on chronic conditions we are managing for pt.  1. All recent blood work that we ordered was reviewed with patient today.  Patient was counseled on all abnormalities and we discussed dietary and lifestyle changes that could help those values (also medications when appropriate).  Extensive health counseling performed and all patient's concerns/ questions were addressed.  See labs below and also plan for more details of these abnormalities  Says she has been okay.  Notes she's been trying to take care of herself during COVID, wearing masks, etc. Notes her daughter-in-law is an Therapist, sports, and she's worried about her safety sometimes.  - Ear Concerns Says she experiences a sensation of "a little bit of air in my ear." Confirms it is more of a "buzzing" sensation.   - Ulcerative Colitis Had a colonoscopy "with issues" and had to go back for an endoscopy, "but I think we got it straightened out."  Notes Dr. Silverio Decamp changed her prescription for ulcerative colitis, providing the patient with mesalamine for management.  Per patient, her UC "is my biggest problem."  - CLL followed by Hematology/Oncology Has been in touch with the CLL doctors, hematology/oncology.  Notes she last went at the end of August, and was told "you're doing so good, you can come back in a year."  - Vitamin D Supplementation She continues taking her once-weekly Vitamin D.  - B12 Supplementation Continues taking B12, 1000 daily.  HPI:   Diabetes Mellitus:  Home glucose readings:     - Patient reports good compliance with therapy plan: medication  and/or lifestyle modification  - Her denies acute concerns or problems related to treatment plan  - She denies new concerns.  Denies polyuria/polydipsia, hypo/ hyperglycemia symptoms.  Denies new onset of: chest pain, exercise intolerance, shortness of breath, dizziness, visual changes, headache, lower extremity swelling or claudication.   Last A1C in the office was:  Lab Results  Component Value Date   HGBA1C 6.5 (H) 09/03/2019   HGBA1C 6.2 (A) 10/06/2018   HGBA1C 6.2 05/05/2018   Lab Results  Component Value Date   LDLCALC 82 09/03/2019   CREATININE 0.72 09/03/2019   BP Readings from Last 3 Encounters:  09/10/19 126/79  06/25/19 139/70  05/31/19 (!) 145/64   Wt Readings from Last 3 Encounters:  09/10/19 169 lb 1.6 oz (76.7 kg)  06/25/19 165 lb 1.9 oz (74.9 kg)  05/31/19 168 lb (76.2 kg)    HPI:  Hyperlipidemia:  78 y.o. female here for cholesterol follow-up.   -  Patient reports good compliance with treatment plan of:  medication and/ or lifestyle management.    - Patient denies any acute concerns or problems with management plan   - She denies new onset of: myalgias, arthralgias, increased fatigue more than normal, chest pains, exercise intolerance, shortness of breath, dizziness, visual changes, headache, lower extremity swelling or claudication.   Most recent cholesterol panel was:  Lab Results  Component Value Date   CHOL 161 09/03/2019   HDL 47 09/03/2019   LDLCALC 82 09/03/2019   TRIG 189 (H) 09/03/2019   CHOLHDL 3.4 09/03/2019   Hepatic Function Latest Ref Rng & Units 09/03/2019 06/25/2019 07/09/2018  Total Protein 6.0 - 8.5 g/dL 6.7 6.6 7.0  Albumin 3.7 - 4.7 g/dL 4.4 4.4 3.9  AST 0 - 40 IU/L 26 21 28   ALT 0 - 32 IU/L 29 21 28   Alk Phosphatase 39 - 117 IU/L 81 69 68  Total Bilirubin 0.0 - 1.2 mg/dL 0.5 0.4 0.6    Wt Readings from Last 3 Encounters:  09/10/19 169 lb 1.6 oz (76.7 kg)  06/25/19 165 lb 1.9 oz (74.9 kg)  05/31/19 168 lb (76.2 kg)   BP  Readings from Last 3 Encounters:  09/10/19 126/79  06/25/19 139/70  05/31/19 (!) 145/64   Pulse Readings from Last 3 Encounters:  09/10/19 74  06/25/19 73  05/31/19 (!) 57   BMI Readings from Last 3 Encounters:  09/10/19 27.71 kg/m  06/25/19 27.06 kg/m  05/31/19 27.96 kg/m     Patient Care Team    Relationship Specialty Notifications Start End  Mellody Dance, DO PCP - General Family Medicine  06/11/17     Full medical history updated and reviewed in the office today  Patient Active Problem List   Diagnosis Date Noted  . Diabetes mellitus without complication ([place]) 08/17/1218    Priority: High  . Hypertension associated with diabetes ([place]) 06/11/2017    Priority: Medium  . Hyperlipidemia associated with type 2 diabetes mellitus ([place]) 06/11/2017    Priority: Medium  . Ulcerative colitis ([place]) 06/11/2017    Priority: Medium  . CLL (chronic lymphocytic leukemia) ([place]) 06/11/2017    Priority: Low  . Polyp of colon 06/11/2017    Priority: Low  . History of colonic polyps   . Polyp of rectum   . B12 deficiency 02/11/2019  . Family history of B12 deficiency 10/06/2018  . History of irregular heartbeat-  infreq episodes and asymptomatic lasting less than 1-2 minutes 05/05/2018  . Counseling on health promotion and disease prevention 11/20/2017  . Hyperkeratotic Skin lesion of right leg- changing recently 11/20/2017  . Vitamin D insufficiency 06/24/2017  . GERD (gastroesophageal reflux disease) 06/11/2017  . S/P hysterectomy- about 23yr ago 06/11/2017  . Other fatigue 06/11/2017  . Osteoporosis screening 06/11/2017    Past Medical History:  Diagnosis Date  . Cancer ([place] 2015   CLL, no treatment necessary  . Diabetes mellitus without complication ([place]   . Hyperlipidemia   . Hypertension   . 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 ABDOMINAL HYSTERECTOMY    . FLEXIBLE SIGMOIDOSCOPY N/A 05/31/2019   Procedure:  FLEXIBLE SIGMOIDOSCOPY;  Surgeon: NMauri Pole MD;  Location: [facility] ENDOSCOPY;  Service: Endoscopy;  Laterality: N/A;  . HEMOSTASIS CLIP PLACEMENT  05/31/2019   Procedure: HEMOSTASIS CLIP PLACEMENT;  Surgeon: NMauri Pole MD;  Location: [facility] ENDOSCOPY;  Service: Endoscopy;;  . POLYPECTOMY  05/31/2019   Procedure: POLYPECTOMY;  Surgeon: Mauri Pole, MD;  Location: [facility];  Service: Endoscopy;;  . TONSILLECTOMY AND ADENOIDECTOMY      Social History   Tobacco Use  . Smoking status: Never Smoker  . Smokeless tobacco: Never Used  Substance Use Topics  . Alcohol use: No    Family Hx: Family History  Problem Relation Age of Onset  . Cancer Mother        colon  . Hyperlipidemia Mother   . Hypertension Mother   . Colon cancer Mother   . Esophageal cancer Neg Hx   . Rectal cancer Neg Hx   . Stomach cancer Neg Hx      Medications: Current Outpatient Medications  Medication Sig Dispense Refill  . acetaminophen (TYLENOL) 500 MG tablet Take 1,000 mg by mouth every 6 (six) hours as needed (pain.).    Marland Kitchen Alcohol Swabs (B-D SINGLE USE SWABS REGULAR) PADS 1 Package by Does not apply route 2 (two) times daily. 100 each 0  . Blood Glucose Calibration (ACCU-CHEK AVIVA) SOLN 1 drop by In Vitro route 2 (two) times daily. 1 each 11  . cyanocobalamin 1000 MCG tablet Take 1 tablet (1,000 mcg total) by mouth daily. 90 tablet 1  . hydrochlorothiazide (HYDRODIURIL) 25 MG tablet Take 1 tablet (25 mg total) by mouth daily. 90 tablet 1  . losartan (COZAAR) 100 MG tablet Take 1 tablet (100 mg total) by mouth daily. 90 tablet 1  . mesalamine (LIALDA) 1.2 g EC tablet Take 4 tablets (4.8 g total) by mouth daily with breakfast. 360 tablet 3  . metFORMIN (GLUCOPHAGE) 500 MG tablet TAKE 1/2 TABLET TWICE DAILY WITH A MEAL 90 tablet 1  . Vitamin D, Ergocalciferol, (DRISDOL) 1.25 MG (50000 UT) CAPS capsule Take 1 capsule (50,000 Units total) by mouth once a week. Swallow whole.  Do NOT chew or  crush. (either Sundays or Mondays) 12 capsule 3  . atorvastatin (LIPITOR) 20 MG tablet Take 1 tablet (20 mg total) by mouth daily. 90 tablet 1   No current facility-administered medications for this visit.     Allergies:  No Known Allergies   Review of Systems: General:   No F/C, wt loss Pulm:   No DIB, SOB, pleuritic chest pain Card:  No CP, palpitations Abd:  No n/v/d or pain Ext:  No inc edema from baseline  Objective:  Blood pressure 126/79, pulse 74, temperature 97.7 F (36.5 C), temperature source Oral, resp. rate 12, height 5' 5.5" (1.664 m), weight 169 lb 1.6 oz (76.7 kg), SpO2 96 %. Body mass index is 27.71 kg/m. Gen:   Well NAD, A and O *3 HEENT:    [place]/AT, EOMI,  MMM Lungs:   Normal work of breathing. CTA B/L, no Wh, rhonchi Heart:   RRR, S1, S2 WNL's, no MRG Abd:   No gross distention Exts:    warm, pink,  Brisk capillary refill, warm and well perfused.  Psych:    No HI/SI, judgement and insight good, Euthymic mood. Full Affect.   Recent Results (from the past 2160 hour(s))  CBC with Differential (Cancer Center Only)     Status: Abnormal   Collection Time: 06/25/19  3:12 PM  Result Value Ref Range   WBC Count 27.4 (H) 4.0 - 10.5 K/uL   RBC 4.39 3.87 - 5.11 MIL/uL   Hemoglobin 13.3 12.0 - 15.0 g/dL   HCT 40.1 36.0 - 46.0 %   MCV 91.3 80.0 - 100.0 fL   MCH 30.3 26.0 - 34.0 pg  MCHC 33.2 30.0 - 36.0 g/dL   RDW 12.2 11.5 - 15.5 %   Platelet Count 255 150 - 400 K/uL   nRBC 0.0 0.0 - 0.2 %   Neutrophils Relative % 23 %   Neutro Abs 6.4 1.7 - 7.7 K/uL   Lymphocytes Relative 75 %    Comment: smudge cells. variant lymphs.   Lymphs Abs 20.3 (H) 0.7 - 4.0 K/uL   Monocytes Relative 2 %   Monocytes Absolute 0.5 0.1 - 1.0 K/uL   Eosinophils Relative 0 %   Eosinophils Absolute 0.1 0.0 - 0.5 K/uL   Basophils Relative 0 %   Basophils Absolute 0.1 0.0 - 0.1 K/uL   Immature Granulocytes 0 %   Abs Immature Granulocytes 0.06 0.00 - 0.07 K/uL    Comment: Performed at  [facility] at [facility], [address], [place], [place] [address]  CMP ([place] only)     Status: Abnormal   Collection Time: 06/25/19  3:12 PM  Result Value Ref Range   Sodium 133 (L) 135 - 145 mmol/L   Potassium 4.1 3.5 - 5.1 mmol/L   Chloride 96 (L) 98 - 111 mmol/L   CO2 29 22 - 32 mmol/L   Glucose, Bld 113 (H) 70 - 99 mg/dL   BUN 10 8 - 23 mg/dL   Creatinine 0.77 0.44 - 1.00 mg/dL   Calcium 9.3 8.9 - 10.3 mg/dL   Total Protein 6.6 6.5 - 8.1 g/dL   Albumin 4.4 3.5 - 5.0 g/dL   AST 21 15 - 41 U/L   ALT 21 0 - 44 U/L   Alkaline Phosphatase 69 38 - 126 U/L   Total Bilirubin 0.4 0.3 - 1.2 mg/dL   GFR, Est Non Af Am >60 >60 mL/min   GFR, Est AFR Am >60 >60 mL/min   Anion gap 8 5 - 15    Comment: Performed at [facility] at [facility], [address] [place], [place] [address]  IgG, IgA, IgM     Status: Abnormal   Collection Time: 06/25/19  3:12 PM  Result Value Ref Range   IgG (Immunoglobin G), Serum 856 586 - 1,602 mg/dL   IgA 132 64 - 422 mg/dL   IgM (Immunoglobulin M), Srm 14 (L) 26 - 217 mg/dL    Comment: (NOTE) Result confirmed on concentration. Performed At: [facility] [place], [place] [id number] Rush Farmer MD BP:[id number]   Save Smear [facility])     Status: None   Collection Time: 06/25/19  3:15 PM  Result Value Ref Range   Smear Review SMEAR STAINED AND AVAILABLE FOR REVIEW     Comment: Performed at [facility] at [facility], [address] [place], [place] [address]  Vitamin B12     Status: None   Collection Time: 09/03/19  9:38 AM  Result Value Ref Range   Vitamin B-12 1,120 232 - 1,245 pg/mL  T3     Status: None   Collection Time: 09/03/19  9:38 AM  Result Value Ref Range   T3, Total 156 71 - 180 ng/dL  T4, free     Status: None   Collection Time: 09/03/19  9:38 AM  Result Value Ref Range   Free T4 1.38 0.82 - 1.77 ng/dL  VITAMIN D 25  Hydroxy (Vit-D Deficiency, Fractures)     Status: None   Collection Time: 09/03/19  9:38 AM  Result Value Ref Range   Vit D, 25-Hydroxy 48.7 30.0 - 100.0 ng/mL    Comment: Vitamin D deficiency has been defined by the [place] practice guideline as a level of serum 25-OH vitamin D less than 20 ng/mL (1,2). The Endocrine Society went on to further define vitamin D insufficiency as a level between 21 and 29 ng/mL (2). 1. IOM (Institute of Medicine). 2010. Dietary reference    intakes for calcium and D. [place]: The    [employer]. 2. Holick MF, Binkley [place], Bischoff-Ferrari HA, et al.    Evaluation, treatment, and prevention of vitamin D    deficiency: an Endocrine Society clinical practice    guideline. JCEM. 2011 Jul; 96(7):1911-30.   TSH     Status: None   Collection Time: 09/03/19  9:38 AM  Result Value Ref Range   TSH 2.130 0.450 - 4.500 uIU/mL  Lipid panel     Status: Abnormal   Collection Time: 09/03/19  9:38 AM  Result Value Ref Range   Cholesterol, Total 161 100 - 199 mg/dL   Triglycerides 189 (H) 0 - 149 mg/dL   HDL 47 >39 mg/dL   VLDL Cholesterol Cal 32 5 - 40 mg/dL   LDL Chol Calc (NIH) 82 0 - 99 mg/dL   Chol/HDL Ratio 3.4 0.0 - 4.4 ratio    Comment:                                   T. Chol/HDL Ratio                                             Men  Women                               1/2 Avg.Risk  3.4    3.3                                   Avg.Risk  5.0    4.4                                2X Avg.Risk  9.6    7.1                                3X Avg.Risk 23.4   11.0   Hemoglobin A1c     Status: Abnormal   Collection Time: 09/03/19  9:38 AM  Result Value Ref Range   Hgb A1c MFr Bld 6.5 (H) 4.8 - 5.6 %    Comment:          Prediabetes: 5.7 - 6.4          Diabetes: >6.4          Glycemic control for adults with diabetes: <7.0    Est. average glucose Bld gHb Est-mCnc 140 mg/dL  Comprehensive metabolic  panel     Status: Abnormal   Collection Time: 09/03/19  9:38 AM  Result Value Ref Range   Glucose 114 (H) 65 - 99  mg/dL   BUN 9 8 - 27 mg/dL   Creatinine, Ser 0.72 0.57 - 1.00 mg/dL   GFR calc non Af Amer 82 >59 mL/min/1.73   GFR calc Af Amer 94 >59 mL/min/1.73   BUN/Creatinine Ratio 13 12 - 28   Sodium 133 (L) 134 - 144 mmol/L   Potassium 4.5 3.5 - 5.2 mmol/L   Chloride 95 (L) 96 - 106 mmol/L   CO2 24 20 - 29 mmol/L   Calcium 9.8 8.7 - 10.3 mg/dL   Total Protein 6.7 6.0 - 8.5 g/dL   Albumin 4.4 3.7 - 4.7 g/dL   Globulin, Total 2.3 1.5 - 4.5 g/dL   Albumin/Globulin Ratio 1.9 1.2 - 2.2   Bilirubin Total 0.5 0.0 - 1.2 mg/dL   Alkaline Phosphatase 81 39 - 117 IU/L   AST 26 0 - 40 IU/L   ALT 29 0 - 32 IU/L  CBC with Differential/Platelet     Status: Abnormal   Collection Time: 09/03/19  9:38 AM  Result Value Ref Range   WBC 32.7 (HH) 3.4 - 10.8 x10E3/uL   RBC 4.66 3.77 - 5.28 x10E6/uL   Hemoglobin 13.9 11.1 - 15.9 g/dL   Hematocrit 40.9 34.0 - 46.6 %   MCV 88 79 - 97 fL   MCH 29.8 26.6 - 33.0 pg   MCHC 34.0 31.5 - 35.7 g/dL   RDW 12.8 11.7 - 15.4 %   Platelets 233 150 - 450 x10E3/uL   Neutrophils 14 Not Estab. %   Lymphs 82 Not Estab. %    Comment: Smudge cells present Atypical lymphocytes.    Monocytes 2 Not Estab. %   Eos 1 Not Estab. %   Basos 1 Not Estab. %   Neutrophils Absolute 4.4 1.4 - 7.0 x10E3/uL   Lymphocytes Absolute 27.3 (H) 0.7 - 3.1 x10E3/uL   Monocytes Absolute 0.5 0.1 - 0.9 x10E3/uL   EOS (ABSOLUTE) 0.3 0.0 - 0.4 x10E3/uL   Basophils Absolute 0.2 0.0 - 0.2 x10E3/uL   Immature Granulocytes 0 Not Estab. %   Immature Grans (Abs) 0.0 0.0 - 0.1 x10E3/uL   Hematology Comments: Note:     Comment: Verified by microscopic examination.

## 2019-09-15 ENCOUNTER — Other Ambulatory Visit: Payer: Self-pay | Admitting: Family Medicine

## 2019-09-15 DIAGNOSIS — Z1231 Encounter for screening mammogram for malignant neoplasm of breast: Secondary | ICD-10-CM

## 2019-09-30 ENCOUNTER — Telehealth: Payer: Self-pay | Admitting: Family Medicine

## 2019-09-30 NOTE — Telephone Encounter (Signed)
Attempted to call patient back with no answer. Left message for patient to call back. AS, CMA

## 2019-09-30 NOTE — Telephone Encounter (Signed)
Patient called when office clsd for lunch to advice ( something is happening to Rt eye) msg barely understandable via voice.Mackenzie Barber   ---Called patient back left message to contact office to clarify what's happenong.  -glh

## 2019-10-26 ENCOUNTER — Other Ambulatory Visit: Payer: Medicare PPO

## 2019-10-29 ENCOUNTER — Ambulatory Visit: Payer: Medicare PPO | Admitting: Family Medicine

## 2019-11-02 ENCOUNTER — Telehealth: Payer: Self-pay | Admitting: Family Medicine

## 2019-11-02 NOTE — Telephone Encounter (Signed)
Patient is a Dr. Jenetta Downer patient and patient's daughter, [name], called with some unspecified concerns and questions regarding her mother and would like a call back about these issues. Please Advise. - AM  Patient's daughter: Nikola Blackston [phone]

## 2019-11-03 NOTE — Telephone Encounter (Signed)
Patient's daughter states that she dropped off paperwork this morning for accommodations for her employer.  Daughter is aware that Dr. Raliegh Scarlet is out of the office this week and will address upon her return next week.  Charyl Bigger, CMA

## 2019-11-09 ENCOUNTER — Other Ambulatory Visit: Payer: Self-pay

## 2019-11-09 ENCOUNTER — Ambulatory Visit
Admission: RE | Admit: 2019-11-09 | Discharge: 2019-11-09 | Disposition: A | Discharge status: Home / Self Care | Payer: Medicare PPO | Source: Ambulatory Visit | Attending: Family Medicine | Admitting: Family Medicine

## 2019-11-09 DIAGNOSIS — Z1231 Encounter for screening mammogram for malignant neoplasm of breast: Secondary | ICD-10-CM

## 2019-11-15 ENCOUNTER — Telehealth: Payer: Self-pay | Admitting: Family Medicine

## 2019-11-15 NOTE — Telephone Encounter (Signed)
Patient's daughter called and is requesting to speak to clinic staff and to also get disability paperwork update. Please advise.

## 2019-11-15 NOTE — Telephone Encounter (Signed)
Spoke with patient daughter and she is aware she dropped off ADA paper work and not [employer]. She said she would obtain the FMLA paperwork and bring to our office. AS, CMA

## 2019-11-16 ENCOUNTER — Ambulatory Visit: Payer: Medicare PPO | Attending: Internal Medicine

## 2019-11-16 DIAGNOSIS — Z20822 Contact with and (suspected) exposure to covid-19: Secondary | ICD-10-CM | POA: Diagnosis not present

## 2019-11-18 LAB — NOVEL CORONAVIRUS, NAA: SARS-CoV-2, NAA: NOT DETECTED

## 2019-12-17 ENCOUNTER — Telehealth: Payer: Self-pay | Admitting: Gastroenterology

## 2019-12-17 MED ORDER — MESALAMINE 1.2 G PO TBEC: 4.8000 g | DELAYED_RELEASE_TABLET | Freq: Every day | ORAL | 3 refills | Status: DC

## 2019-12-17 NOTE — Telephone Encounter (Signed)
Lialda refilled as patient requested.

## 2019-12-17 NOTE — Telephone Encounter (Signed)
Pt requested prescription for Lialda to be resent to [facility] order pharmacy.  She stated that pharmacy is only filling 30 days this year.

## 2019-12-20 ENCOUNTER — Ambulatory Visit: Payer: Medicare HMO | Attending: Internal Medicine

## 2020-01-12 ENCOUNTER — Other Ambulatory Visit: Payer: Self-pay | Admitting: Family Medicine

## 2020-01-12 DIAGNOSIS — E119 Type 2 diabetes mellitus without complications: Secondary | ICD-10-CM

## 2020-01-12 DIAGNOSIS — I10 Essential (primary) hypertension: Secondary | ICD-10-CM

## 2020-03-01 ENCOUNTER — Other Ambulatory Visit: Payer: Self-pay | Admitting: Family Medicine

## 2020-03-01 DIAGNOSIS — I10 Essential (primary) hypertension: Secondary | ICD-10-CM

## 2020-03-01 MED ORDER — LOSARTAN POTASSIUM 100 MG PO TABS: 100.0000 mg | ORAL_TABLET | Freq: Every day | ORAL | 0 refills | Status: DC

## 2020-03-01 MED ORDER — VITAMIN B-12 1000 MCG PO TABS: 1000.0000 ug | ORAL_TABLET | Freq: Every day | ORAL | 0 refills | Status: DC

## 2020-03-01 MED ORDER — HYDROCHLOROTHIAZIDE 25 MG PO TABS: 25.0000 mg | ORAL_TABLET | Freq: Every day | ORAL | 0 refills | Status: DC

## 2020-03-01 NOTE — Telephone Encounter (Signed)
Patient called states she unfortunately sick w/ flu or winter virus ( COVID test was neg) but she remained sick-- Per pt has had COVID vaccine & is now ready to resume OVs.. Dr.O's LOV instructions :    TELEHEALTH--CALL-- [phone]---[facility]ER for 6wk-lab only FLP and ALT-which would be 3 days prior to medicare wellness OV in 6.5 wks.   ---- Patient scheduled appt / MWE & labs, knows Rx refills tied to OV, she did scheduled provider's 1st available  (03/29/20).  1)-- hydrochlorothiazide (HYDRODIURIL) 25 MG tablet [[id number]]   Order Details Dose: 25 mg Route: Oral Frequency: Daily  Dispense Quantity: 30 tablet Refills: 0       Sig: Take 1 tablet (25 mg total) by mouth daily. **PATIENT NEEDS APT FOR FURTHER REFILLS**       &   2)--- losartan (COZAAR) 100 MG tablet [[id number]]   Order Details Dose: 100 mg Route: Oral Frequency: Daily  Dispense Quantity: 30 tablet Refills: 0       Sig: Take 1 tablet (100 mg total) by mouth daily. **PATIENT NEEDS APT FOR FURTHER REFILLS**   3)---vitamin B-12 (CYANOCOBALAMIN) 1000 MCG tablet [[id number]]   Order Details Dose: 1,000 mcg Route: Oral Frequency: Daily  Dispense Quantity: 30 tablet Refills: 0       Sig: Take 1 tablet (1,000 mcg total) by mouth daily. **PATIENT NEEDS APT FOR FURTHER REFILLS**   ---Forwarding refill request to med asst --that if approved send order to : [address], [address], [place] [address]  Phone:  [phone] Fax:  [phone]

## 2020-03-01 NOTE — Telephone Encounter (Signed)
#  15 each sent to [facility] drug per refill protocol. Left message for patient with this information. AS, CMA

## 2020-03-02 ENCOUNTER — Telehealth: Payer: Self-pay | Admitting: Family Medicine

## 2020-03-02 NOTE — Telephone Encounter (Signed)
Unable to give refill per our refill policy. AS, CMA

## 2020-03-02 NOTE — Telephone Encounter (Signed)
Pt called to scheduled Appt for 04/04/20,& request refills, says she lives with daughter who is preparing to sell their home & move to the other side of [place] and she doesn't want to run out of meds  --Per patient prior to that move she will try to locate a in [place] PCP because she has [facility] HMO cvg for 2021) unsure of how long that will take.   ---- Patient  thinks will need at least -90day supply of her medications to cover her till provider is found.  --FYI to med asst ,previously pt refills on hold till after Appt (it has been scheduled) explained to pt addt'l meds refills will have to wait till after her OV.  --glh

## 2020-03-03 ENCOUNTER — Other Ambulatory Visit: Payer: Medicare HMO

## 2020-03-13 ENCOUNTER — Other Ambulatory Visit: Payer: Self-pay | Admitting: Physician Assistant

## 2020-03-13 DIAGNOSIS — I152 Hypertension secondary to endocrine disorders: Secondary | ICD-10-CM

## 2020-03-13 DIAGNOSIS — E1159 Type 2 diabetes mellitus with other circulatory complications: Secondary | ICD-10-CM

## 2020-03-13 DIAGNOSIS — E1169 Type 2 diabetes mellitus with other specified complication: Secondary | ICD-10-CM

## 2020-03-13 NOTE — Progress Notes (Signed)
Per last OV 08/2019: Return for 6wk-lab only FLP and ALT-which would be 3 days prior to medicare wellness OV in 6.5 wks.

## 2020-03-14 ENCOUNTER — Other Ambulatory Visit: Payer: Self-pay

## 2020-03-14 ENCOUNTER — Other Ambulatory Visit: Payer: Medicare HMO

## 2020-03-14 DIAGNOSIS — E1159 Type 2 diabetes mellitus with other circulatory complications: Secondary | ICD-10-CM

## 2020-03-14 DIAGNOSIS — E785 Hyperlipidemia, unspecified: Secondary | ICD-10-CM | POA: Diagnosis not present

## 2020-03-14 DIAGNOSIS — E1169 Type 2 diabetes mellitus with other specified complication: Secondary | ICD-10-CM | POA: Diagnosis not present

## 2020-03-14 DIAGNOSIS — I10 Essential (primary) hypertension: Secondary | ICD-10-CM | POA: Diagnosis not present

## 2020-03-15 LAB — ALT: ALT: 33 IU/L — ABNORMAL HIGH (ref 0–32)

## 2020-03-15 LAB — LIPID PANEL
Chol/HDL Ratio: 3.2 ratio (ref 0.0–4.4)
Cholesterol, Total: 152 mg/dL (ref 100–199)
HDL: 48 mg/dL (ref >39)
LDL Chol Calc (NIH): 72 mg/dL (ref 0–99)
Triglycerides: 194 mg/dL — ABNORMAL HIGH (ref 0–149)
VLDL Cholesterol Cal: 32 mg/dL (ref 5–40)

## 2020-03-30 ENCOUNTER — Ambulatory Visit (INDEPENDENT_AMBULATORY_CARE_PROVIDER_SITE_OTHER): Payer: Medicare HMO | Admitting: Physician Assistant

## 2020-03-30 ENCOUNTER — Encounter: Payer: Self-pay | Admitting: Physician Assistant

## 2020-03-30 ENCOUNTER — Other Ambulatory Visit: Payer: Self-pay

## 2020-03-30 DIAGNOSIS — Z0000 Encounter for general adult medical examination without abnormal findings: Secondary | ICD-10-CM | POA: Diagnosis not present

## 2020-03-30 NOTE — Progress Notes (Signed)
Virtual Visit via Telephone Note:  I connected with Mackenzie Barber on 03/30/2020 by telephone and verified that I am speaking with the correct person using two identifiers.   I discussed the limitations, risks, security and privacy concerns for performing an evaluation and management service by telephone and the availability of in person appointments. The staff discussed with patient that there may be a patient responsible charge related to this service. The patient expressed understanding and agreed to proceed.  Location of Patient- Home Location of Provider- Office  Subjective:   Mackenzie Barber is a 79 y.o. female who presents for Medicare Annual (Subsequent) preventive examination.  Review of Systems:  Review of Systems: General:   No F/C, wt loss Pulm:   No DIB, SOB, pleuritic chest pain Card:  No CP, palpitations Abd:  No n/v/d or pain Ext:  No inc edema from baseline   Objective:     Vitals: There were no vitals taken for this visit.  There is no height or weight on file to calculate BMI.  Advanced Directives 06/25/2019 05/31/2019 08/14/2017 06/11/2017  Does Patient Have a Medical Advance Directive? Yes Yes No No  Type of Paramedic of [place];Living will [place];Living will - -  Does patient want to make changes to medical advance directive? No - Patient declined - - -  Copy of [place] in Chart? No - copy requested No - copy requested - -  Would patient like information on creating a medical advance directive? - - - Yes (MAU/Ambulatory/Procedural Areas - Information given)    Tobacco Social History   Tobacco Use  Smoking Status Never Smoker  Smokeless Tobacco Never Used     Counseling given: Not Answered    Past Medical History:  Diagnosis Date  . 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 Diabetes mellitus without complication ([place])   . Hyperlipidemia   . Hypertension   . Ulcerative  proctitis [facility])    Past Surgical History:  Procedure Laterality Date  . ABDOMINAL HYSTERECTOMY    . FLEXIBLE SIGMOIDOSCOPY N/A 05/31/2019   Procedure: FLEXIBLE SIGMOIDOSCOPY;  Surgeon: Mauri Pole, MD;  Location: [facility] ENDOSCOPY;  Service: Endoscopy;  Laterality: N/A;  . HEMOSTASIS CLIP PLACEMENT  05/31/2019   Procedure: HEMOSTASIS CLIP PLACEMENT;  Surgeon: Mauri Pole, MD;  Location: [facility] ENDOSCOPY;  Service: Endoscopy;;  . POLYPECTOMY  05/31/2019   Procedure: POLYPECTOMY;  Surgeon: Mauri Pole, MD;  Location: [facility] ENDOSCOPY;  Service: Endoscopy;;  . TONSILLECTOMY AND ADENOIDECTOMY     Family History  Problem Relation Age of Onset  . Cancer Mother        colon  . Hyperlipidemia Mother   . Hypertension Mother   . Colon cancer Mother   . Esophageal cancer Neg Hx   . Rectal cancer Neg Hx   . Stomach cancer Neg Hx    Social History   Socioeconomic History  . Marital status: Single    Spouse name: Not on file  . Number of children: 2  . Years of education: Not on file  . Highest education level: Not on file  Occupational History  . Occupation: retired  Tobacco Use  . Smoking status: Never Smoker  . Smokeless tobacco: Never Used  Substance and Sexual Activity  . Alcohol use: No  . Drug use: No  . Sexual activity: Never  Other Topics Concern  . Not on file  Social History Narrative  .  Not on file   Social Determinants of Health   Financial Resource Strain:   . Difficulty of Paying Living Expenses:   Food Insecurity:   . Worried About Charity fundraiser in the Last Year:   . Arboriculturist in the Last Year:   Transportation Needs:   . Film/video editor (Medical):   Marland Kitchen Lack of Transportation (Non-Medical):   Physical Activity:   . Days of Exercise per Week:   . Minutes of Exercise per Session:   Stress:   . Feeling of Stress :   Social Connections:   . Frequency of Communication with Friends and Family:   . Frequency of Social Gatherings with  Friends and Family:   . Attends Religious Services:   . Active Member of Clubs or Organizations:   . Attends Archivist Meetings:   Marland Kitchen Marital Status:     Outpatient Encounter Medications as of 03/30/2020  Medication Sig  . acetaminophen (TYLENOL) 500 MG tablet Take 1,000 mg by mouth every 6 (six) hours as needed (pain.).  Marland Kitchen Alcohol Swabs (B-D SINGLE USE SWABS REGULAR) PADS 1 Package by Does not apply route 2 (two) times daily.  Marland Kitchen atorvastatin (LIPITOR) 20 MG tablet Take 1 tablet (20 mg total) by mouth daily.  . Blood Glucose Calibration (ACCU-CHEK AVIVA) SOLN 1 drop by In Vitro route 2 (two) times daily.  . hydrochlorothiazide (HYDRODIURIL) 25 MG tablet Take 1 tablet (25 mg total) by mouth daily. **PATIENT NEEDS APT FOR FURTHER REFILLS**  . losartan (COZAAR) 100 MG tablet Take 1 tablet (100 mg total) by mouth daily. **PATIENT NEEDS APT FOR FURTHER REFILLS**  . mesalamine (LIALDA) 1.2 g EC tablet Take 4 tablets (4.8 g total) by mouth daily with breakfast.  . metFORMIN (GLUCOPHAGE) 500 MG tablet TAKE 1/2 TABLET TWICE DAILY WITH A MEAL **PATIENT NEEDS APT FOR FURTHER REFILLS**  . pseudoephedrine-acetaminophen (TYLENOL SINUS) 30-500 MG TABS tablet Take 1 tablet by mouth every 4 (four) hours as needed.  . vitamin B-12 (CYANOCOBALAMIN) 1000 MCG tablet Take 1 tablet (1,000 mcg total) by mouth daily. **PATIENT NEEDS APT FOR FURTHER REFILLS**  . Vitamin D, Ergocalciferol, (DRISDOL) 1.25 MG (50000 UT) CAPS capsule Take 1 capsule (50,000 Units total) by mouth once a week. Swallow whole.  Do NOT chew or crush. (either Sundays or Mondays)   No facility-administered encounter medications on file as of 03/30/2020.    Activities of Daily Living In your present state of health, do you have any difficulty performing the following activities: 03/30/2020  Hearing? N  Vision? N  Difficulty concentrating or making decisions? N  Walking or climbing stairs? N  Dressing or bathing? N  Doing errands,  shopping? N  Some recent data might be hidden    Patient Care Team: Ellouise Newer as PCP - General    Assessment:   This is a routine wellness examination for Mackenzie Barber.  Exercise Activities and Dietary recommendations - Continue daily 30 minute walks and yard work - Continue to reduce cholesterol/sat and trans fat  Goals   None     Fall Risk Fall Risk  03/30/2020 09/10/2019 02/11/2019 01/08/2019 05/05/2018  Falls in the past year? 0 0 0 0 No  Number falls in past yr: - 0 - - -  Injury with Fall? - 0 - - -  Follow up Falls evaluation completed Falls evaluation completed - - -   Is the patient's home free of loose throw rugs in walkways, pet beds, electrical  cords, etc?   yes      Grab bars in the bathroom? yes      Handrails on the stairs?   yes      Adequate lighting?   yes    Depression Screen PHQ 2/9 Scores 03/30/2020 09/10/2019 02/11/2019 01/08/2019  PHQ - 2 Score 2 2 0 1  PHQ- 9 Score 3 6 - 3     Cognitive Function: WNL     6CIT Screen 03/30/2020  What Year? 0 points  What month? 0 points  What time? 0 points  Count back from 20 0 points  Months in reverse 0 points  Repeat phrase 2 points  Total Score 2    Immunization History  Administered Date(s) Administered  . Fluad Quad(high Dose 65+) 09/03/2019  . Influenza, High Dose Seasonal PF 10/29/2017, 10/06/2018    Qualifies for Shingles Vaccine? Pt has CLL- shingles vaccine contraindicated  Screening Tests Health Maintenance  Topic Date Due  . OPHTHALMOLOGY EXAM  Never done  . COVID-19 Vaccine (1) Never done  . TETANUS/TDAP  Never done  . DEXA SCAN  Never done  . PNA vac Low Risk Adult (1 of 2 - PCV13) Never done  . FOOT EXAM  05/06/2019  . HEMOGLOBIN A1C  03/03/2020  . INFLUENZA VACCINE  06/11/2020    Cancer Screenings: Lung: Low Dose CT Chest recommended if Age 9-80 years, 30 pack-year currently smoking OR have quit w/in 15years. Patient does not qualify. No history of smoking Breast:  Up to  date on Mammogram? No- aged out per current guidelines   Up to date of Bone Density/Dexa? No- Pt will consider and let us know if she wishes to proceed Colorectal: patient has aged out per current guidelines  Additional Screenings:: Hepatitis C Screening:  06/16/2017- negative HIV Screening:  06/16/2017- negative     Plan:  - Continue current medication regimen - Continue to stay as active as possible - Follow heart healthy diet - Stay well hydrated- at least 64 fl oz - Follow up in 6 months for HTN, HLD, DM, Vit d insuff and FBW  I have personally reviewed and noted the following in the patient's chart:   . Medical and social history . Use of alcohol, tobacco or illicit drugs  . Current medications and supplements . Functional ability and status . Nutritional status . Physical activity . Advanced directives . List of other physicians . Hospitalizations, surgeries, and ER visits in previous 12 months . Vitals . Screenings to include cognitive, depression, and falls . Referrals and appointments  In addition, I have reviewed and discussed with patient certain preventive protocols, quality metrics, and best practice recommendations. A written personalized care plan for preventive services as well as general preventive health recommendations were provided to patient.     Lorrene Reid, PA-C  03/30/2020

## 2020-03-31 ENCOUNTER — Other Ambulatory Visit: Payer: Self-pay | Admitting: Physician Assistant

## 2020-03-31 DIAGNOSIS — E119 Type 2 diabetes mellitus without complications: Secondary | ICD-10-CM

## 2020-03-31 DIAGNOSIS — I10 Essential (primary) hypertension: Secondary | ICD-10-CM

## 2020-03-31 MED ORDER — ATORVASTATIN CALCIUM 20 MG PO TABS: 20.0000 mg | ORAL_TABLET | Freq: Every day | ORAL | 1 refills | Status: DC

## 2020-03-31 MED ORDER — GLUCOSE BLOOD VI STRP: ORAL_STRIP | 12 refills | Status: DC

## 2020-03-31 MED ORDER — ACCU-CHEK SOFTCLIX LANCETS MISC: 12 refills | Status: DC

## 2020-03-31 MED ORDER — METFORMIN HCL 500 MG PO TABS: ORAL_TABLET | ORAL | 1 refills | Status: DC

## 2020-03-31 MED ORDER — LOSARTAN POTASSIUM 100 MG PO TABS: 100.0000 mg | ORAL_TABLET | Freq: Every day | ORAL | 1 refills | Status: DC

## 2020-03-31 MED ORDER — HYDROCHLOROTHIAZIDE 25 MG PO TABS: 25.0000 mg | ORAL_TABLET | Freq: Every day | ORAL | 1 refills | Status: DC

## 2020-03-31 MED ORDER — VITAMIN B-12 1000 MCG PO TABS: 1000.0000 ug | ORAL_TABLET | Freq: Every day | ORAL | 1 refills | Status: DC

## 2020-03-31 NOTE — Telephone Encounter (Signed)
Patient called states did her MWE w/ provider & forgot to request refills on all her meds   --Pt ask for 3 month / 90dy supply to mail order pharmacy.  Forwarding request to refill :   1 )----atorvastatin (LIPITOR) 20 MG tablet [[id number]]   Order Details Dose: 20 mg Route: Oral Frequency: Daily  Dispense Quantity: 90 tablet Refills: 1       Sig: Take 1 tablet (20 mg total) by mouth daily.        2)--- ACCU-CHEK AVIVA :   Test Strips & Lancets for glucose meter   3 )------hydrochlorothiazide (HYDRODIURIL) 25 MG tablet [[id number]] DISCONTINUED  Order Details Dose: 25 mg Route: Oral Frequency: Daily  Dispense Quantity: 90 tablet Refills: 1       Sig: Take 1 tablet (25 mg total) by mouth daily.        4 ) ------- losartan (COZAAR) 100 MG tablet [[id number]] DISCONTINUED  Order Details Dose: 100 mg Route: Oral Frequency: Daily  Dispense Quantity: 90 tablet Refills: 1       Sig: Take 1 tablet (100 mg total) by mouth daily.    5 )------ metFORMIN (GLUCOPHAGE) 500 MG tablet [[id number]] DISCONTINUED  Order Details Dose, Route, Frequency: As Directed  Dispense Quantity: 90 tablet Refills: 1       Sig: TAKE 1/2 TABLET TWICE DAILY WITH A MEAL    6 )-----vitamin B-12 (CYANOCOBALAMIN) 1000 MCG tablet [[id number]] DISCONTINUED  Order Details Dose: 1,000 mcg Route: Oral Frequency: Daily  Dispense Quantity: 30 tablet Refills: 0       Sig: Take 1 tablet (1,000 mcg total) by mouth daily. **PATIENT NEEDS APT FOR FURTHER REFILLS**    --Please send refill order to :   [place], [place] [phone] (Phone) [phone] (Fax)   --glh

## 2020-04-26 DIAGNOSIS — H40033 Anatomical narrow angle, bilateral: Secondary | ICD-10-CM | POA: Diagnosis not present

## 2020-04-26 DIAGNOSIS — E119 Type 2 diabetes mellitus without complications: Secondary | ICD-10-CM | POA: Diagnosis not present

## 2020-05-10 ENCOUNTER — Telehealth: Payer: Self-pay | Admitting: Hematology & Oncology

## 2020-05-10 NOTE — Telephone Encounter (Signed)
Provider schedule change updated calendar was mailed

## 2020-06-23 ENCOUNTER — Ambulatory Visit: Payer: Medicare PPO | Admitting: Hematology & Oncology

## 2020-06-23 ENCOUNTER — Other Ambulatory Visit: Payer: Medicare PPO

## 2020-06-30 ENCOUNTER — Inpatient Hospital Stay (HOSPITAL_BASED_OUTPATIENT_CLINIC_OR_DEPARTMENT_OTHER): Payer: Medicare HMO | Admitting: Hematology & Oncology

## 2020-06-30 ENCOUNTER — Other Ambulatory Visit: Payer: Self-pay | Admitting: *Deleted

## 2020-06-30 ENCOUNTER — Inpatient Hospital Stay: Payer: Medicare HMO | Attending: Hematology & Oncology

## 2020-06-30 ENCOUNTER — Other Ambulatory Visit: Payer: Self-pay

## 2020-06-30 ENCOUNTER — Encounter: Payer: Self-pay | Admitting: Hematology & Oncology

## 2020-06-30 VITALS — BP 136/58 | HR 72 | Temp 97.9°F | Resp 18 | Ht 65.5 in | Wt 167.8 lb

## 2020-06-30 DIAGNOSIS — Z7984 Long term (current) use of oral hypoglycemic drugs: Secondary | ICD-10-CM | POA: Diagnosis not present

## 2020-06-30 DIAGNOSIS — K5190 Ulcerative colitis, unspecified, without complications: Secondary | ICD-10-CM | POA: Diagnosis not present

## 2020-06-30 DIAGNOSIS — C9110 Chronic lymphocytic leukemia of B-cell type not having achieved remission: Secondary | ICD-10-CM

## 2020-06-30 DIAGNOSIS — Z79899 Other long term (current) drug therapy: Secondary | ICD-10-CM | POA: Diagnosis not present

## 2020-06-30 LAB — CMP (CANCER CENTER ONLY)
ALT: 33 U/L (ref 0–44)
AST: 30 U/L (ref 15–41)
Albumin: 4.5 g/dL (ref 3.5–5.0)
Alkaline Phosphatase: 61 U/L (ref 38–126)
Anion gap: 9 (ref 5–15)
BUN: 11 mg/dL (ref 8–23)
CO2: 28 mmol/L (ref 22–32)
Calcium: 9.7 mg/dL (ref 8.9–10.3)
Chloride: 95 mmol/L — ABNORMAL LOW (ref 98–111)
Creatinine: 0.69 mg/dL (ref 0.44–1.00)
GFR, Est AFR Am: >60 mL/min (ref >60)
GFR, Estimated: >60 mL/min (ref >60)
Glucose, Bld: 144 mg/dL — ABNORMAL HIGH (ref 70–99)
Potassium: 3.8 mmol/L (ref 3.5–5.1)
Sodium: 132 mmol/L — ABNORMAL LOW (ref 135–145)
Total Bilirubin: 0.4 mg/dL (ref 0.3–1.2)
Total Protein: 6.9 g/dL (ref 6.5–8.1)

## 2020-06-30 LAB — CBC WITH DIFFERENTIAL (CANCER CENTER ONLY)
Abs Immature Granulocytes: 0.07 10*3/uL (ref 0.00–0.07)
Basophils Absolute: 0.1 10*3/uL (ref 0.0–0.1)
Basophils Relative: 0 %
Eosinophils Absolute: 0.2 10*3/uL (ref 0.0–0.5)
Eosinophils Relative: 1 %
HCT: 39.7 % (ref 36.0–46.0)
Hemoglobin: 13.5 g/dL (ref 12.0–15.0)
Immature Granulocytes: 0 %
Lymphocytes Relative: 76 %
Lymphs Abs: 20.8 10*3/uL — ABNORMAL HIGH (ref 0.7–4.0)
MCH: 30.7 pg (ref 26.0–34.0)
MCHC: 34 g/dL (ref 30.0–36.0)
MCV: 90.2 fL (ref 80.0–100.0)
Monocytes Absolute: 0.5 10*3/uL (ref 0.1–1.0)
Monocytes Relative: 2 %
Neutro Abs: 5.8 10*3/uL (ref 1.7–7.7)
Neutrophils Relative %: 21 %
Platelet Count: 240 10*3/uL (ref 150–400)
RBC: 4.4 MIL/uL (ref 3.87–5.11)
RDW: 12.5 % (ref 11.5–15.5)
WBC Count: 27.4 10*3/uL — ABNORMAL HIGH (ref 4.0–10.5)
nRBC: 0 % (ref 0.0–0.2)

## 2020-06-30 NOTE — Progress Notes (Signed)
Hematology and Oncology Follow Up Visit  Mackenzie Barber [id number] 1941/03/21 79 y.o. 06/30/2020   Principle Diagnosis:   CLL -- Stage A  Current Therapy:    Observation     Interim History:  Mackenzie Barber is back for follow-up.  She is doing pretty well.  We see her yearly.  Since we last saw her, she really has had no problems. She did have a mammogram done back in December. Everything looked fine on the mammogram.  [place] the [place] times. [place] of [place] She likes to go up there.  She has had the coronavirus vaccine. She had no problems with the vaccine.  She has had no issues with fever. She has had no infections. She has had no change in bowel or bladder habits. She did have a flareup of the ulcerative colitis last year.  She would like to see the gastroenterologist in our building. They would be very convenient for her to see so that she does not have to go into [place].  She has had no problems with rashes. There is been no bleeding. She has had no leg swelling.  Overall, her performance status is ECOG 0.    Medications:  Current Outpatient Medications:  .  Accu-Chek Softclix Lancets lancets, Use to check blood sugars every morning fasting and 2 hours after largest meal, Disp: 200 each, Rfl: 12 .  acetaminophen (TYLENOL) 500 MG tablet, Take 1,000 mg by mouth every 6 (six) hours as needed (pain.)., Disp: , Rfl:  .  Alcohol Swabs (B-D SINGLE USE SWABS REGULAR) PADS, 1 Package by Does not apply route 2 (two) times daily., Disp: 100 each, Rfl: 0 .  atorvastatin (LIPITOR) 20 MG tablet, Take 1 tablet (20 mg total) by mouth daily., Disp: 90 tablet, Rfl: 1 .  Blood Glucose Calibration (ACCU-CHEK AVIVA) SOLN, 1 drop by In Vitro route 2 (two) times daily., Disp: 1 each, Rfl: 11 .  glucose blood test strip, Accuchek Aviva Test strips Use to check blood sugars every morning fasting and 2 hours after largest meal,  Disp: 200 each, Rfl: 12 .  hydrochlorothiazide (HYDRODIURIL) 25 MG tablet, Take 1 tablet (25 mg total) by mouth daily., Disp: 90 tablet, Rfl: 1 .  losartan (COZAAR) 100 MG tablet, Take 1 tablet (100 mg total) by mouth daily., Disp: 90 tablet, Rfl: 1 .  mesalamine (LIALDA) 1.2 g EC tablet, Take 4 tablets (4.8 g total) by mouth daily with breakfast., Disp: 360 tablet, Rfl: 3 .  metFORMIN (GLUCOPHAGE) 500 MG tablet, TAKE 1/2 TABLET TWICE DAILY WITH A MEAL, Disp: 90 tablet, Rfl: 1 .  pseudoephedrine-acetaminophen (TYLENOL SINUS) 30-500 MG TABS tablet, Take 1 tablet by mouth every 4 (four) hours as needed., Disp: , Rfl:  .  vitamin B-12 (CYANOCOBALAMIN) 1000 MCG tablet, Take 1 tablet (1,000 mcg total) by mouth daily., Disp: 90 tablet, Rfl: 1 .  Vitamin D, Ergocalciferol, (DRISDOL) 1.25 MG (50000 UT) CAPS capsule, Take 1 capsule (50,000 Units total) by mouth once a week. Swallow whole.  Do NOT chew or crush. (either Sundays or Mondays), Disp: 12 capsule, Rfl: 3  Allergies: No Known Allergies  Past Medical History, Surgical history, Social history, and Family History were reviewed and updated.  Review of Systems: Review of Systems  Constitutional: Negative.   HENT:  Negative.   Eyes: Negative.   Respiratory: Negative.   Cardiovascular: Negative.   Gastrointestinal: Negative.   Endocrine: Negative.  Genitourinary: Negative.    Musculoskeletal: Negative.   Skin: Negative.   Neurological: Negative.   Hematological: Negative.   Psychiatric/Behavioral: Negative.     Physical Exam:  height is 5' 5.5" (1.664 m) and weight is 167 lb 12.8 oz (76.1 kg). Her oral temperature is 97.9 F (36.6 C). Her blood pressure is 136/58 (abnormal) and her pulse is 72. Her respiration is 18 and oxygen saturation is 96%.   Wt Readings from Last 3 Encounters:  06/30/20 167 lb 12.8 oz (76.1 kg)  09/10/19 169 lb 1.6 oz (76.7 kg)  06/25/19 165 lb 1.9 oz (74.9 kg)    Physical Exam Vitals reviewed.  HENT:      Head: Normocephalic and atraumatic.  Eyes:     Pupils: Pupils are equal, round, and reactive to light.  Cardiovascular:     Rate and Rhythm: Normal rate and regular rhythm.     Heart sounds: Normal heart sounds.  Pulmonary:     Effort: Pulmonary effort is normal.     Breath sounds: Normal breath sounds.  Abdominal:     General: Bowel sounds are normal.     Palpations: Abdomen is soft.  Musculoskeletal:        General: No tenderness or deformity. Normal range of motion.     Cervical back: Normal range of motion.  Lymphadenopathy:     Cervical: No cervical adenopathy.  Skin:    General: Skin is warm and dry.     Findings: No erythema or rash.  Neurological:     Mental Status: She is alert and oriented to person, place, and time.  Psychiatric:        Behavior: Behavior normal.        Thought Content: Thought content normal.        Judgment: Judgment normal.      Lab Results  Component Value Date   WBC 27.4 (H) 06/30/2020   HGB 13.5 06/30/2020   HCT 39.7 06/30/2020   MCV 90.2 06/30/2020   PLT 240 06/30/2020     Chemistry      Component Value Date/Time   NA 132 (L) 06/30/2020 1322   NA 133 (L) 09/03/2019 0938   NA 137 08/14/2017 1216   K 3.8 06/30/2020 1322   K 3.7 08/14/2017 1216   CL 95 (L) 06/30/2020 1322   CL 101 08/14/2017 1216   CO2 28 06/30/2020 1322   CO2 28 08/14/2017 1216   BUN 11 06/30/2020 1322   BUN 9 09/03/2019 0938   BUN 8 08/14/2017 1216   CREATININE 0.69 06/30/2020 1322   CREATININE 0.8 08/14/2017 1216   GLU 122 11/21/2016 0000      Component Value Date/Time   CALCIUM 9.7 06/30/2020 1322   CALCIUM 9.7 08/14/2017 1216   ALKPHOS 61 06/30/2020 1322   ALKPHOS 73 08/14/2017 1216   AST 30 06/30/2020 1322   ALT 33 06/30/2020 1322   ALT 18 08/14/2017 1216   BILITOT 0.4 06/30/2020 1322       Impression and Plan: Mackenzie Barber is a 79 year old white female with CLL.  She has had this for about 8 years.  She is doing quite well with this.  I do not  see any evidence that the CLL is getting worse.  Everything really looks quite stable.  We will still plan to get her back in 1 year.   Volanda Napoleon, MD 8/20/20212:14 PM

## 2020-07-31 ENCOUNTER — Encounter: Payer: Self-pay | Admitting: *Deleted

## 2020-07-31 ENCOUNTER — Other Ambulatory Visit: Payer: Self-pay

## 2020-07-31 ENCOUNTER — Ambulatory Visit
Admission: EM | Admit: 2020-07-31 | Discharge: 2020-07-31 | Disposition: A | Discharge status: Home / Self Care | Payer: Medicare HMO | Attending: Family Medicine | Admitting: Family Medicine

## 2020-07-31 DIAGNOSIS — H60392 Other infective otitis externa, left ear: Secondary | ICD-10-CM | POA: Diagnosis not present

## 2020-07-31 MED ORDER — NEOMYCIN-POLYMYXIN-HC 3.5-10000-1 OT SUSP: 4.0000 [drp] | Freq: Three times a day (TID) | OTIC | 0 refills | Status: DC

## 2020-07-31 MED ORDER — MUPIROCIN CALCIUM 2 % NA OINT: TOPICAL_OINTMENT | NASAL | 0 refills | Status: DC

## 2020-07-31 NOTE — ED Provider Notes (Signed)
[facility]    CSN: [id number] Arrival date & time: 07/31/20  1108      History   Chief Complaint No chief complaint on file.   HPI Mackenzie Barber is a 79 y.o. female.   Complains of left-sided ear pain.  No congestion or loss of hearing.  She can make pain worse by tugging on ear  HPI  Past Medical History:  Diagnosis Date  . Cancer ([place]) 2015   CLL, no treatment necessary  . Diabetes mellitus without complication ([place])   . Hyperlipidemia   . Hypertension   . Ulcerative proctitis [facility])     Patient Active Problem List   Diagnosis Date Noted  . History of colonic polyps   . Polyp of rectum   . B12 deficiency 02/11/2019  . Family history of B12 deficiency 10/06/2018  . History of irregular heartbeat-  infreq episodes and asymptomatic lasting less than 1-2 minutes 05/05/2018  . Counseling on health promotion and disease prevention 11/20/2017  . Hyperkeratotic Skin lesion of right leg- changing recently 11/20/2017  . Vitamin D insufficiency 06/24/2017  . Diabetes mellitus without complication ([place]) 22/97/9892  . Hypertension associated with diabetes ([place]) 06/11/2017  . Hyperlipidemia associated with type 2 diabetes mellitus ([place]) 06/11/2017  . CLL (chronic lymphocytic leukemia) ([place]) 06/11/2017  . Ulcerative colitis ([place]) 06/11/2017  . Polyp of colon 06/11/2017  . GERD (gastroesophageal reflux disease) 06/11/2017  . S/P hysterectomy- about 3yr ago 06/11/2017  . Other fatigue 06/11/2017  . Osteoporosis screening 06/11/2017    Past Surgical History:  Procedure Laterality Date  . ABDOMINAL HYSTERECTOMY    . FLEXIBLE SIGMOIDOSCOPY N/A 05/31/2019   Procedure: FLEXIBLE SIGMOIDOSCOPY;  Surgeon: NMauri Pole MD;  Location: [facility];  Service: Endoscopy;  Laterality: N/A;  . HEMOSTASIS CLIP PLACEMENT  05/31/2019   Procedure: HEMOSTASIS CLIP PLACEMENT;  Surgeon: NMauri Pole MD;  Location: [facility];  Service: Endoscopy;;  . POLYPECTOMY   05/31/2019   Procedure: POLYPECTOMY;  Surgeon: NMauri Pole MD;  [facility];  Service: Endoscopy;;  . TONSILLECTOMY AND ADENOIDECTOMY      OB History   No obstetric history on file.      Home Medications    Prior to Admission medications   Medication Sig Start Date End Date Taking? Authorizing Provider  Accu-Chek Softclix Lancets lancets Use to check blood sugars every morning fasting and 2 hours after largest meal 03/31/20  Yes Abonza, Maritza, PA-C  acetaminophen (TYLENOL) 500 MG tablet Take 1,000 mg by mouth every 6 (six) hours as needed (pain.).   Yes [provider]  atorvastatin (LIPITOR) 20 MG tablet Take 1 tablet (20 mg total) by mouth daily. 03/31/20  Yes Abonza, Maritza, PA-C  glucose blood test strip Accuchek Aviva Test strips Use to check blood sugars every morning fasting and 2 hours after largest meal 03/31/20  Yes Abonza, Maritza, PA-C  hydrochlorothiazide (HYDRODIURIL) 25 MG tablet Take 1 tablet (25 mg total) by mouth daily. 03/31/20  Yes Abonza, Maritza, PA-C  losartan (COZAAR) 100 MG tablet Take 1 tablet (100 mg total) by mouth daily. 03/31/20  Yes ALorrene Reid PA-C  mesalamine (LIALDA) 1.2 g EC tablet Take 4 tablets (4.8 g total) by mouth daily with breakfast. 12/17/19  Yes Nandigam, KVenia Minks MD  metFORMIN (GLUCOPHAGE) 500 MG tablet TAKE 1/2 TABLET TWICE DAILY WITH A MEAL 03/31/20  Yes Abonza, Maritza, PA-C  vitamin B-12 (CYANOCOBALAMIN) 1000 MCG tablet Take 1 tablet (1,000 mcg total) by mouth daily. 03/31/20  Yes Abonza, Maritza, PA-C  Vitamin D, Ergocalciferol, (DRISDOL) 1.25 MG (50000 UT) CAPS capsule Take 1 capsule (50,000 Units total) by mouth once a week. Swallow whole.  Do NOT chew or crush. (either Sundays or Mondays) 09/10/19  Yes Opalski, Neoma Laming, DO  Alcohol Swabs (B-D SINGLE USE SWABS REGULAR) PADS 1 Package by Does not apply route 2 (two) times daily. 07/07/17   Opalski, Neoma Laming, DO  Blood Glucose Calibration (ACCU-CHEK AVIVA) SOLN 1  drop by In Vitro route 2 (two) times daily. 07/07/17   Opalski, Neoma Laming, DO  hydrocortisone (ANUSOL-HC) 25 MG suppository Place 25 mg rectally at bedtime. 02/10/20   [provider]  pseudoephedrine-acetaminophen (TYLENOL SINUS) 30-500 MG TABS tablet Take 1 tablet by mouth every 4 (four) hours as needed.    [provider]  vitamin B-12 (CYANOCOBALAMIN) 1000 MCG tablet 1,000 mcg. 04/12/20   [provider]    Family History Family History  Problem Relation Age of Onset  . Cancer Mother        colon  . Hyperlipidemia Mother   . Hypertension Mother   . Colon cancer Mother   . Esophageal cancer Neg Hx   . Rectal cancer Neg Hx   . Stomach cancer Neg Hx     Social History Social History   Tobacco Use  . Smoking status: Never Smoker  . Smokeless tobacco: Never Used  Vaping Use  . Vaping Use: Never used  Substance Use Topics  . Alcohol use: No  . Drug use: No     Allergies   Patient has no known allergies.   Review of Systems Review of Systems  HENT: Positive for ear pain.   All other systems reviewed and are negative.    Physical Exam Triage Vital Signs ED Triage Vitals  Enc Vitals Group     BP 07/31/20 1233 (!) 157/85     Pulse Rate 07/31/20 1233 74     Resp 07/31/20 1233 16     Temp 07/31/20 1233 98.5 F (36.9 C)     Temp src --      SpO2 07/31/20 1233 95 %     Weight --      Height --      Head Circumference --      Peak Flow --      Pain Score 07/31/20 1234 9     Pain Loc --      Pain Edu? --      Excl. in [place]? --    No data found.  Updated Vital Signs BP (!) 157/85 (BP Location: Left Arm)   Pulse 74   Temp 98.5 F (36.9 C)   Resp 16   SpO2 95%   Visual Acuity Right Eye Distance:   Left Eye Distance:   Bilateral Distance:    Right Eye Near:   Left Eye Near:    Bilateral Near:     Physical Exam Constitutional:      Appearance: Normal appearance.  HENT:     Ears:     Comments: Left external auditory canal  slightly red Left TM appears normal Skin:    Comments: Skin above her left ear is red and warm to touch Appears to be mild case of cellulitis  Neurological:     Mental Status: She is alert.      UC Treatments / Results  Labs (all labs ordered are listed, but only abnormal results are displayed) Labs Reviewed - No data to display  EKG  Radiology No results found.  Procedures Procedures (including critical care time)  Medications Ordered in UC Medications - No data to display  Initial Impression / Assessment and Plan / UC Course  I have reviewed the triage vital signs and the nursing notes.  Pertinent labs & imaging results that were available during my care of the patient were reviewed by me and considered in my medical decision making (see chart for details).     Left otitis externa Final Clinical Impressions(s) / UC Diagnoses   Final diagnoses:  None   Discharge Instructions   None    ED Prescriptions    None     PDMP not reviewed this encounter.   Wardell Honour, MD 07/31/20 1259

## 2020-07-31 NOTE — ED Triage Notes (Signed)
Patient in with complaints of left sided ear pain that started on Saturday. Pt denies decreased hearing. Patient does experience dizziness when the ear is throbbing.Patient also has a place above left ear that is sore to touch. Patient states that throbbing in left ear comes and goes. Patient has been taking tylenol to help with pain. Patient states that right ear has ringing/buzzing sensation that has been ongoing or a while.

## 2020-08-03 ENCOUNTER — Ambulatory Visit: Payer: Medicare HMO

## 2020-08-04 ENCOUNTER — Other Ambulatory Visit: Payer: Self-pay

## 2020-08-04 ENCOUNTER — Ambulatory Visit (INDEPENDENT_AMBULATORY_CARE_PROVIDER_SITE_OTHER): Payer: Medicare HMO | Admitting: Physician Assistant

## 2020-08-04 ENCOUNTER — Encounter: Payer: Self-pay | Admitting: Physician Assistant

## 2020-08-04 VITALS — BP 126/78 | HR 76 | Ht 65.5 in | Wt 167.5 lb

## 2020-08-04 DIAGNOSIS — H6690 Otitis media, unspecified, unspecified ear: Secondary | ICD-10-CM | POA: Diagnosis not present

## 2020-08-04 DIAGNOSIS — R21 Rash and other nonspecific skin eruption: Secondary | ICD-10-CM | POA: Diagnosis not present

## 2020-08-04 DIAGNOSIS — H9202 Otalgia, left ear: Secondary | ICD-10-CM | POA: Diagnosis not present

## 2020-08-04 MED ORDER — IBUPROFEN 800 MG PO TABS: 800.0000 mg | ORAL_TABLET | Freq: Three times a day (TID) | ORAL | 0 refills | Status: DC | PRN

## 2020-08-04 MED ORDER — AMOXICILLIN-POT CLAVULANATE 875-125 MG PO TABS: 1.0000 | ORAL_TABLET | Freq: Two times a day (BID) | ORAL | 0 refills | Status: DC

## 2020-08-04 MED ORDER — TRIAMCINOLONE ACETONIDE 0.1 % EX CREA: 1.0000 "application " | TOPICAL_CREAM | Freq: Two times a day (BID) | CUTANEOUS | 0 refills | Status: DC

## 2020-08-04 NOTE — Patient Instructions (Signed)
Otitis Media, Adult  Otitis media means that the middle ear is red and swollen (inflamed) and full of fluid. The condition usually goes away on its own. Follow these instructions at home:  Take over-the-counter and prescription medicines only as told by your doctor.  If you were prescribed an antibiotic medicine, take it as told by your doctor. Do not stop taking the antibiotic even if you start to feel better.  Keep all follow-up visits as told by your doctor. This is important. Contact a doctor if:  You have bleeding from your nose.  There is a lump on your neck.  You are not getting better in 5 days.  You feel worse instead of better. Get help right away if:  You have pain that is not helped with medicine.  You have swelling, redness, or pain around your ear.  You get a stiff neck.  You cannot move part of your face (paralyzed).  You notice that the bone behind your ear hurts when you touch it.  You get a very bad headache. Summary  Otitis media means that the middle ear is red, swollen, and full of fluid.  This condition usually goes away on its own. In some cases, treatment may be needed.  If you were prescribed an antibiotic medicine, take it as told by your doctor. This information is not intended to replace advice given to you by your health care provider. Make sure you discuss any questions you have with your health care provider. Document Revised: 10/10/2017 Document Reviewed: 11/18/2016 Elsevier Patient Education  [employer].   Rash, Adult  A rash is a change in the color of your skin. A rash can also change the way your skin feels. There are many different conditions and factors that can cause a rash. Follow these instructions at home: The goal of treatment is to stop the itching and keep the rash from spreading. Watch for any changes in your symptoms. Let your doctor know about them. Follow these instructions to help with your  condition: Medicine Take or apply over-the-counter and prescription medicines only as told by your doctor. These may include medicines:  To treat red or swollen skin (corticosteroid creams).  To treat itching.  To treat an allergy (oral antihistamines).  To treat very bad symptoms (oral corticosteroids).  Skin care  Put cool cloths (compresses) on the affected areas.  Do not scratch or rub your skin.  Avoid covering the rash. Make sure that the rash is exposed to air as much as possible. Managing itching and discomfort  Avoid hot showers or baths. These can make itching worse. A cold shower may help.  Try taking a bath with: ? Epsom salts. You can get these at your local pharmacy or grocery store. Follow the instructions on the package. ? Baking soda. Pour a small amount into the bath as told by your doctor. ? Colloidal oatmeal. You can get this at your local pharmacy or grocery store. Follow the instructions on the package.  Try putting baking soda paste onto your skin. Stir water into baking soda until it gets like a paste.  Try putting on a lotion that relieves itchiness (calamine lotion).  Keep cool and out of the sun. Sweating and being hot can make itching worse. General instructions   Rest as needed.  Drink enough fluid to keep your pee (urine) pale yellow.  Wear loose-fitting clothing.  Avoid scented soaps, detergents, and perfumes. Use gentle soaps, detergents, perfumes, and other cosmetic  products.  Avoid anything that causes your rash. Keep a journal to help track what causes your rash. Write down: ? What you eat. ? What cosmetic products you use. ? What you drink. ? What you wear. This includes jewelry.  Keep all follow-up visits as told by your doctor. This is important. Contact a doctor if:  You sweat at night.  You lose weight.  You pee (urinate) more than normal.  You pee less than normal, or you notice that your pee is a darker color than  normal.  You feel weak.  You throw up (vomit).  Your skin or the whites of your eyes look yellow (jaundice).  Your skin: ? Tingles. ? Is numb.  Your rash: ? Does not go away after a few days. ? Gets worse.  You are: ? More thirsty than normal. ? More tired than normal.  You have: ? New symptoms. ? Pain in your belly (abdomen). ? A fever. ? Watery poop (diarrhea). Get help right away if:  You have a fever and your symptoms suddenly get worse.  You start to feel mixed up (confused).  You have a very bad headache or a stiff neck.  You have very bad joint pains or stiffness.  You have jerky movements that you cannot control (seizure).  Your rash covers all or most of your body. The rash may or may not be painful.  You have blisters that: ? Are on top of the rash. ? Grow larger. ? Grow together. ? Are painful. ? Are inside your nose or mouth.  You have a rash that: ? Looks like purple pinprick-sized spots all over your body. ? Has a "bull's eye" or looks like a target. ? Is red and painful, causes your skin to peel, and is not from being in the sun too long. Summary  A rash is a change in the color of your skin. A rash can also change the way your skin feels.  The goal of treatment is to stop the itching and keep the rash from spreading.  Take or apply over-the-counter and prescription medicines only as told by your doctor.  Contact a doctor if you have new symptoms or symptoms that get worse.  Keep all follow-up visits as told by your doctor. This is important. This information is not intended to replace advice given to you by your health care provider. Make sure you discuss any questions you have with your health care provider. Document Revised: 02/19/2019 Document Reviewed: 06/01/2018 Elsevier Patient Education  [place].

## 2020-08-04 NOTE — Progress Notes (Signed)
Acute Office Visit  Subjective:    Patient ID: Mackenzie Barber, female    DOB: Aug 30, 1941, 79 y.o.   MRN: [id number]  Chief Complaint  Patient presents with  . Ear Pain    HPI Patient is in today for left, throbbing ear pain that started last Sunday. States on Monday went to urgent care and was given ear drops, which she used once and discontinued due to stinging and burning sensation.  She has been taking Tylenol for ear pain which provides temporary relief. She has noticed clear ear drainage mostly from her right ear. Denies fever, chills, or night sweats.  Also reports she has a rash she wants looked at that she recently noticed behind her ear and on her neck. States the rash was initially mildly tender but now it is itchy.  Past Medical History:  Diagnosis Date  . Cancer ([place]) 2015   CLL, no treatment necessary  . Diabetes mellitus without complication ([place])   . Hyperlipidemia   . Hypertension   . Ulcerative proctitis [facility])     Past Surgical History:  Procedure Laterality Date  . ABDOMINAL HYSTERECTOMY    . FLEXIBLE SIGMOIDOSCOPY N/A 05/31/2019   Procedure: FLEXIBLE SIGMOIDOSCOPY;  Surgeon: Mauri Pole, MD;  [facility];  Service: Endoscopy;  Laterality: N/A;  . HEMOSTASIS CLIP PLACEMENT  05/31/2019   Procedure: HEMOSTASIS CLIP PLACEMENT;  Surgeon: Mauri Pole, MD;  [facility];  Service: Endoscopy;;  . POLYPECTOMY  05/31/2019   Procedure: POLYPECTOMY;  Surgeon: Mauri Pole, MD;  [facility];  Service: Endoscopy;;  . TONSILLECTOMY AND ADENOIDECTOMY      Family History  Problem Relation Age of Onset  . Cancer Mother        colon  . Hyperlipidemia Mother   . Hypertension Mother   . Colon cancer Mother   . Esophageal cancer Neg Hx   . Rectal cancer Neg Hx   . Stomach cancer Neg Hx     Social History   Socioeconomic History  . Marital status: Single    Spouse name: Not on file  . Number of children: 2  .  Years of education: Not on file  . Highest education level: Not on file  Occupational History  . Occupation: retired  Tobacco Use  . Smoking status: Never Smoker  . Smokeless tobacco: Never Used  Vaping Use  . Vaping Use: Never used  Substance and Sexual Activity  . Alcohol use: No  . Drug use: No  . Sexual activity: Never  Other Topics Concern  . Not on file  Social History Narrative  . Not on file   Social Determinants of Health   Financial Resource Strain:   . Difficulty of Paying Living Expenses: Not on file  Food Insecurity:   . Worried About Charity fundraiser in the Last Year: Not on file  . Ran Out of Food in the Last Year: Not on file  Transportation Needs:   . Lack of Transportation (Medical): Not on file  . Lack of Transportation (Non-Medical): Not on file  Physical Activity:   . Days of Exercise per Week: Not on file  . Minutes of Exercise per Session: Not on file  Stress:   . Feeling of Stress : Not on file  Social Connections:   . Frequency of Communication with Friends and Family: Not on file  . Frequency of Social Gatherings with Friends and Family: Not on file  . Attends Religious Services:  Not on file  . Active Member of Clubs or Organizations: Not on file  . Attends Archivist Meetings: Not on file  . Marital Status: Not on file  Intimate Partner Violence:   . Fear of Current or Ex-Partner: Not on file  . Emotionally Abused: Not on file  . Physically Abused: Not on file  . Sexually Abused: Not on file    Outpatient Medications Prior to Visit  Medication Sig Dispense Refill  . Accu-Chek Softclix Lancets lancets Use to check blood sugars every morning fasting and 2 hours after largest meal 200 each 12  . acetaminophen (TYLENOL) 500 MG tablet Take 1,000 mg by mouth every 6 (six) hours as needed (pain.).    Marland Kitchen Alcohol Swabs (B-D SINGLE USE SWABS REGULAR) PADS 1 Package by Does not apply route 2 (two) times daily. 100 each 0  . atorvastatin  (LIPITOR) 20 MG tablet Take 1 tablet (20 mg total) by mouth daily. 90 tablet 1  . Blood Glucose Calibration (ACCU-CHEK AVIVA) SOLN 1 drop by In Vitro route 2 (two) times daily. 1 each 11  . glucose blood test strip Accuchek Aviva Test strips Use to check blood sugars every morning fasting and 2 hours after largest meal 200 each 12  . losartan (COZAAR) 100 MG tablet Take 1 tablet (100 mg total) by mouth daily. 90 tablet 1  . mesalamine (LIALDA) 1.2 g EC tablet Take 4 tablets (4.8 g total) by mouth daily with breakfast. 360 tablet 3  . metFORMIN (GLUCOPHAGE) 500 MG tablet TAKE 1/2 TABLET TWICE DAILY WITH A MEAL 90 tablet 1  . mupirocin nasal ointment (BACTROBAN) 2 % Apply in each nostril daily (Patient not taking: Reported on 08/10/2020) 1 g 0  . pseudoephedrine-acetaminophen (TYLENOL SINUS) 30-500 MG TABS tablet Take 1 tablet by mouth every 4 (four) hours as needed.    . vitamin B-12 (CYANOCOBALAMIN) 1000 MCG tablet Take 1 tablet (1,000 mcg total) by mouth daily. 90 tablet 1  . vitamin B-12 (CYANOCOBALAMIN) 1000 MCG tablet 1,000 mcg.    . Vitamin D, Ergocalciferol, (DRISDOL) 1.25 MG (50000 UT) CAPS capsule Take 1 capsule (50,000 Units total) by mouth once a week. Swallow whole.  Do NOT chew or crush. (either Sundays or Mondays) 12 capsule 3  . hydrochlorothiazide (HYDRODIURIL) 25 MG tablet Take 1 tablet (25 mg total) by mouth daily. 90 tablet 1  . hydrocortisone (ANUSOL-HC) 25 MG suppository Place 25 mg rectally at bedtime.     Marland Kitchen neomycin-polymyxin-hydrocortisone (CORTISPORIN) 3.5-10000-1 OTIC suspension Place 4 drops into the left ear 3 (three) times daily. (Patient not taking: Reported on 08/04/2020) 10 mL 0   No facility-administered medications prior to visit.    No Known Allergies  Review of Systems Review of Systems:  A fourteen system review of systems was performed and found to be positive as per HPI.  Objective:    Physical Exam General:  Well Developed, well nourished, appropriate  for stated age.  Neuro:  Alert and oriented,  extra-ocular muscles intact  HEENT:  Normocephalic, atraumatic, Erythematous, tender, and mildly bulging TM of left ear, MEE of right ear, neck supple, no adenopathy Skin: Maculopapular rash present at left lower side of the neck, anterior neck, and left temporal side of scalp Cardiac:  RRR, S1 S2 Respiratory:  ECTA B/L and A/P, Not using accessory muscles, speaking in full sentences- unlabored. Vascular:  Ext warm, no cyanosis apprec.; cap RF less 2 sec. Psych:  No HI/SI, judgement and insight good, Euthymic mood.  Full Affect.  BP 126/78   Pulse 76   Ht 5' 5.5" (1.664 m)   Wt 167 lb 8 oz (76 kg)   SpO2 97%   BMI 27.45 kg/m  Wt Readings from Last 3 Encounters:  08/10/20 167 lb 3.2 oz (75.8 kg)  08/04/20 167 lb 8 oz (76 kg)  06/30/20 167 lb 12.8 oz (76.1 kg)    Health Maintenance Due  Topic Date Due  . OPHTHALMOLOGY EXAM  Never done  . COVID-19 Vaccine (1) Never done  . TETANUS/TDAP  Never done  . DEXA SCAN  Never done  . PNA vac Low Risk Adult (1 of 2 - PCV13) Never done  . FOOT EXAM  05/06/2019  . INFLUENZA VACCINE  06/11/2020    There are no preventive care reminders to display for this patient.   Lab Results  Component Value Date   TSH 2.130 09/03/2019   Lab Results  Component Value Date   WBC 27.4 (H) 06/30/2020   HGB 13.5 06/30/2020   HCT 39.7 06/30/2020   MCV 90.2 06/30/2020   PLT 240 06/30/2020   Lab Results  Component Value Date   NA 132 (L) 06/30/2020   K 3.8 06/30/2020   CO2 28 06/30/2020   GLUCOSE 144 (H) 06/30/2020   BUN 11 06/30/2020   CREATININE 0.69 06/30/2020   BILITOT 0.4 06/30/2020   ALKPHOS 61 06/30/2020   AST 30 06/30/2020   ALT 33 06/30/2020   PROT 6.9 06/30/2020   ALBUMIN 4.5 06/30/2020   CALCIUM 9.7 06/30/2020   ANIONGAP 9 06/30/2020   GFR 75.13 01/28/2019   Lab Results  Component Value Date   CHOL 152 03/14/2020   Lab Results  Component Value Date   HDL 48 03/14/2020   Lab  Results  Component Value Date   LDLCALC 72 03/14/2020   Lab Results  Component Value Date   TRIG 194 (H) 03/14/2020   Lab Results  Component Value Date   CHOLHDL 3.2 03/14/2020   Lab Results  Component Value Date   HGBA1C 6.6 (A) 08/10/2020       Assessment & Plan:   Problem List Items Addressed This Visit    None    Visit Diagnoses    Acute otitis media, unspecified otitis media type    -  Primary   Relevant Medications   amoxicillin-clavulanate (AUGMENTIN) 875-125 MG tablet   ibuprofen (ADVIL) 800 MG tablet   Rash       Left ear pain       Relevant Medications   amoxicillin-clavulanate (AUGMENTIN) 875-125 MG tablet   ibuprofen (ADVIL) 800 MG tablet     Acute Otitis media, unspecified otitis media type, left ear pain: -Will start Augmentin for acute otitis media and Ibuprofen as needed for pain relief.  Advised to alternate between Tylenol and Ibuprofen.  Rash: -Etiology is unclear and currently no vesicles present but suspicious for possible shingles. Will send triamcinolone cream. Advised to follow-up next week to reevaluate rash. If starts developing pain or rash changes before appointment recommend to notify the clinic and will start antiviral medication.    Meds ordered this encounter  Medications  . amoxicillin-clavulanate (AUGMENTIN) 875-125 MG tablet    Sig: Take 1 tablet by mouth 2 (two) times daily.    Dispense:  20 tablet    Refill:  0    Order Specific Question:   Supervising Provider    Answer:   Beatrice Lecher D [[id number]]  . ibuprofen (ADVIL) 800 MG tablet  Sig: Take 1 tablet (800 mg total) by mouth every 8 (eight) hours as needed.    Dispense:  30 tablet    Refill:  0    Order Specific Question:   Supervising Provider    Answer:   Beatrice Lecher D [[id number]]  . DISCONTD: triamcinolone cream (KENALOG) 0.1 %    Sig: Apply 1 application topically 2 (two) times daily.    Dispense:  30 g    Refill:  0    Order Specific Question:    Supervising Provider    Answer:   Beatrice Lecher D [[id number]]   Note:  This note was prepared with assistance of Dragon voice recognition software. Occasional wrong-word or sound-a-like substitutions may have occurred due to the inherent limitations of voice recognition software.   Lorrene Reid, PA-C

## 2020-08-07 ENCOUNTER — Telehealth: Payer: Self-pay | Admitting: Physician Assistant

## 2020-08-07 MED ORDER — VALACYCLOVIR HCL 1 G PO TABS: 1000.0000 mg | ORAL_TABLET | Freq: Three times a day (TID) | ORAL | 0 refills | Status: DC

## 2020-08-07 NOTE — Telephone Encounter (Signed)
Pt called and left VM asking to speak with Mackenzie Barber about an unspecified question/concern she has. Please contact when available.

## 2020-08-07 NOTE — Telephone Encounter (Signed)
Patient calling stating that she is not having a shooting pain near eye.   Also, she tried the ointment for the cream and the places got bigger.    Patient would like advise. AS< CMA

## 2020-08-07 NOTE — Telephone Encounter (Signed)
Per [facility] sending in Valtrex. Patient is aware. AS, CMA

## 2020-08-07 NOTE — Addendum Note (Signed)
Addended by: Mickel Crow on: 08/07/2020 11:43 AM   Modules accepted: Orders

## 2020-08-08 ENCOUNTER — Other Ambulatory Visit: Payer: Self-pay | Admitting: Physician Assistant

## 2020-08-08 DIAGNOSIS — I10 Essential (primary) hypertension: Secondary | ICD-10-CM

## 2020-08-10 ENCOUNTER — Ambulatory Visit (INDEPENDENT_AMBULATORY_CARE_PROVIDER_SITE_OTHER): Payer: Medicare HMO | Admitting: Physician Assistant

## 2020-08-10 ENCOUNTER — Other Ambulatory Visit: Payer: Self-pay

## 2020-08-10 ENCOUNTER — Telehealth: Payer: Self-pay | Admitting: Physician Assistant

## 2020-08-10 ENCOUNTER — Encounter: Payer: Self-pay | Admitting: Physician Assistant

## 2020-08-10 VITALS — BP 152/77 | HR 82 | Ht 65.5 in | Wt 167.2 lb

## 2020-08-10 DIAGNOSIS — E119 Type 2 diabetes mellitus without complications: Secondary | ICD-10-CM | POA: Diagnosis not present

## 2020-08-10 DIAGNOSIS — H6690 Otitis media, unspecified, unspecified ear: Secondary | ICD-10-CM

## 2020-08-10 DIAGNOSIS — B029 Zoster without complications: Secondary | ICD-10-CM | POA: Diagnosis not present

## 2020-08-10 LAB — POCT GLYCOSYLATED HEMOGLOBIN (HGB A1C): Hemoglobin A1C: 6.6 % — AB (ref 4.0–5.6)

## 2020-08-10 MED ORDER — BLOOD GLUCOSE METER KIT: PACK | 0 refills | Status: AC

## 2020-08-10 MED ORDER — FAMCICLOVIR 500 MG PO TABS: 500.0000 mg | ORAL_TABLET | Freq: Three times a day (TID) | ORAL | 0 refills | Status: DC

## 2020-08-10 NOTE — Progress Notes (Signed)
Acute Office Visit  Subjective:    Patient ID: Mackenzie Barber, female    DOB: 1941-11-03, 79 y.o.   MRN: [id number]  Chief Complaint  Patient presents with   Rash    HPI Patient is in today for follow up on rash. Patient called earlier this week stating rash had worsened and was painful.  Reports her eye was also hurting which has resolved. Her left ear pain has improved.  She stopped using triamcinolone cream because she felt it made her rash larger.  States when she started taking Valtrex she got a headache and then got a tingling sensation so is inquiring about alternative.  Diabetes: Pt denies increased urination or thirst. Pt reports medication compliance.  Not checking her sugars at home because her machine broke.   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   Diabetes mellitus without complication ([place])    Hyperlipidemia    Hypertension    Ulcerative proctitis ([place])     Past Surgical History:  Procedure Laterality Date   ABDOMINAL HYSTERECTOMY     FLEXIBLE SIGMOIDOSCOPY N/A 05/31/2019   Procedure: FLEXIBLE SIGMOIDOSCOPY;  Surgeon: Mauri Pole, MD;  Location: [facility];  Service: Endoscopy;  Laterality: N/A;   HEMOSTASIS CLIP PLACEMENT  05/31/2019   Procedure: HEMOSTASIS CLIP PLACEMENT;  Surgeon: Mauri Pole, MD;  Location: [facility];  Service: Endoscopy;;   POLYPECTOMY  05/31/2019   Procedure: POLYPECTOMY;  Surgeon: Mauri Pole, MD;  Location: [facility];  Service: Endoscopy;;   TONSILLECTOMY AND ADENOIDECTOMY      Family History  Problem Relation Age of Onset   Cancer Mother        colon   Hyperlipidemia Mother    Hypertension Mother    Colon cancer Mother    Esophageal cancer Neg Hx    Rectal cancer Neg Hx    Stomach cancer Neg Hx     Social History   Socioeconomic History   Marital status: Single    Spouse name: Not on file   Number of children: 2   Years  of education: Not on file   Highest education level: Not on file  Occupational History   Occupation: retired  Tobacco Use   Smoking status: Never Smoker   Smokeless tobacco: Never Used  Scientific laboratory technician Use: Never used  Substance and Sexual Activity   Alcohol use: No   Drug use: No   Sexual activity: Never  Other Topics Concern   Not on file  Social History Narrative   Not on file   Social Determinants of Health   Financial Resource Strain:    Difficulty of Paying Living Expenses: Not on file  Food Insecurity:    Worried About Charity fundraiser in the Last Year: Not on file   [employer] of Food in the Last Year: Not on file  Transportation Needs:    Lack of Transportation (Medical): Not on file   Lack of Transportation (Non-Medical): Not on file  Physical Activity:    Days of Exercise per Week: Not on file   Minutes of Exercise per Session: Not on file  Stress:    Feeling of Stress : Not on file  Social Connections:    Frequency of Communication with Friends and Family: Not on file   Frequency of Social Gatherings with Friends and Family: Not on file   Attends Religious Services: Not on file  Active Member of Clubs or Organizations: Not on file   Attends Archivist Meetings: Not on file   Marital Status: Not on file  Intimate Partner Violence:    Fear of Current or Ex-Partner: Not on file   Emotionally Abused: Not on file   Physically Abused: Not on file   Sexually Abused: Not on file    Outpatient Medications Prior to Visit  Medication Sig Dispense Refill   Accu-Chek Softclix Lancets lancets Use to check blood sugars every morning fasting and 2 hours after largest meal 200 each 12   acetaminophen (TYLENOL) 500 MG tablet Take 1,000 mg by mouth every 6 (six) hours as needed (pain.).     Alcohol Swabs (B-D SINGLE USE SWABS REGULAR) PADS 1 Package by Does not apply route 2 (two) times daily. 100 each 0    amoxicillin-clavulanate (AUGMENTIN) 875-125 MG tablet Take 1 tablet by mouth 2 (two) times daily. 20 tablet 0   atorvastatin (LIPITOR) 20 MG tablet Take 1 tablet (20 mg total) by mouth daily. 90 tablet 1   Blood Glucose Calibration (ACCU-CHEK AVIVA) SOLN 1 drop by In Vitro route 2 (two) times daily. 1 each 11   glucose blood test strip Accuchek Aviva Test strips Use to check blood sugars every morning fasting and 2 hours after largest meal 200 each 12   hydrochlorothiazide (HYDRODIURIL) 25 MG tablet TAKE 1 TABLET (25 MG TOTAL) BY MOUTH DAILY. 90 tablet 1   hydrocortisone (ANUSOL-HC) 25 MG suppository Place 25 mg rectally at bedtime.      ibuprofen (ADVIL) 800 MG tablet Take 1 tablet (800 mg total) by mouth every 8 (eight) hours as needed. 30 tablet 0   losartan (COZAAR) 100 MG tablet Take 1 tablet (100 mg total) by mouth daily. 90 tablet 1   mesalamine (LIALDA) 1.2 g EC tablet Take 4 tablets (4.8 g total) by mouth daily with breakfast. 360 tablet 3   metFORMIN (GLUCOPHAGE) 500 MG tablet TAKE 1/2 TABLET TWICE DAILY WITH A MEAL 90 tablet 1   pseudoephedrine-acetaminophen (TYLENOL SINUS) 30-500 MG TABS tablet Take 1 tablet by mouth every 4 (four) hours as needed.     vitamin B-12 (CYANOCOBALAMIN) 1000 MCG tablet Take 1 tablet (1,000 mcg total) by mouth daily. 90 tablet 1   vitamin B-12 (CYANOCOBALAMIN) 1000 MCG tablet 1,000 mcg.     Vitamin D, Ergocalciferol, (DRISDOL) 1.25 MG (50000 UT) CAPS capsule Take 1 capsule (50,000 Units total) by mouth once a week. Swallow whole.  Do NOT chew or crush. (either Sundays or Mondays) 12 capsule 3   valACYclovir (VALTREX) 1000 MG tablet Take 1 tablet (1,000 mg total) by mouth 3 (three) times daily. 20 tablet 0   mupirocin nasal ointment (BACTROBAN) 2 % Apply in each nostril daily (Patient not taking: Reported on 08/10/2020) 1 g 0   neomycin-polymyxin-hydrocortisone (CORTISPORIN) 3.5-10000-1 OTIC suspension Place 4 drops into the left ear 3 (three)  times daily. (Patient not taking: Reported on 08/04/2020) 10 mL 0   triamcinolone cream (KENALOG) 0.1 % Apply 1 application topically 2 (two) times daily. (Patient not taking: Reported on 08/10/2020) 30 g 0   No facility-administered medications prior to visit.    No Known Allergies  Review of Systems Review of Systems:  A fourteen system review of systems was performed and found to be positive as per HPI.  Objective:    Physical Exam General:  Well Developed, well nourished, appropriate for stated age.  Neuro:  Alert and oriented,  extra-ocular muscles  intact  HEENT:  Normocephalic, atraumatic, normal TM of right ear, Left TM has improved with less erythema and minimal bulging Skin: Crusted lesions are present on lateral neck, new vesicular lesions are present behind left ear, no lesions noted of left eye Cardiac:  RRR, S1 S2 Respiratory:  ECTA B/L and A/P, Not using accessory muscles, speaking in full sentences- unlabored. Vascular:  Ext warm, no cyanosis apprec.; cap RF less 2 sec. Psych:  No HI/SI, judgement and insight good, Euthymic mood. Full Affect.   BP (!) 152/77    Pulse 82    Ht 5' 5.5" (1.664 m)    Wt 167 lb 3.2 oz (75.8 kg)    SpO2 96%    BMI 27.40 kg/m  Wt Readings from Last 3 Encounters:  08/10/20 167 lb 3.2 oz (75.8 kg)  08/04/20 167 lb 8 oz (76 kg)  06/30/20 167 lb 12.8 oz (76.1 kg)    Health Maintenance Due  Topic Date Due   OPHTHALMOLOGY EXAM  Never done   COVID-19 Vaccine (1) Never done   TETANUS/TDAP  Never done   DEXA SCAN  Never done   PNA vac Low Risk Adult (1 of 2 - PCV13) Never done   FOOT EXAM  05/06/2019   INFLUENZA VACCINE  06/11/2020    There are no preventive care reminders to display for this patient.   Lab Results  Component Value Date   TSH 2.130 09/03/2019   Lab Results  Component Value Date   WBC 27.4 (H) 06/30/2020   HGB 13.5 06/30/2020   HCT 39.7 06/30/2020   MCV 90.2 06/30/2020   PLT 240 06/30/2020   Lab Results   Component Value Date   NA 132 (L) 06/30/2020   K 3.8 06/30/2020   CO2 28 06/30/2020   GLUCOSE 144 (H) 06/30/2020   BUN 11 06/30/2020   CREATININE 0.69 06/30/2020   BILITOT 0.4 06/30/2020   ALKPHOS 61 06/30/2020   AST 30 06/30/2020   ALT 33 06/30/2020   PROT 6.9 06/30/2020   ALBUMIN 4.5 06/30/2020   CALCIUM 9.7 06/30/2020   ANIONGAP 9 06/30/2020   GFR 75.13 01/28/2019   Lab Results  Component Value Date   CHOL 152 03/14/2020   Lab Results  Component Value Date   HDL 48 03/14/2020   Lab Results  Component Value Date   LDLCALC 72 03/14/2020   Lab Results  Component Value Date   TRIG 194 (H) 03/14/2020   Lab Results  Component Value Date   CHOLHDL 3.2 03/14/2020   Lab Results  Component Value Date   HGBA1C 6.6 (A) 08/10/2020       Assessment & Plan:   Problem List Items Addressed This Visit      Endocrine   Diabetes mellitus without complication ([place]) - Primary (Chronic)   Relevant Medications   blood glucose meter kit and supplies   Other Relevant Orders   POCT glycosylated hemoglobin (Hb A1C) (Completed)   Ambulatory referral to Ophthalmology    Other Visit Diagnoses    Herpes zoster without complication       Relevant Medications   famciclovir (FAMVIR) 500 MG tablet   Acute otitis media, unspecified otitis media type       Relevant Medications   famciclovir (FAMVIR) 500 MG tablet     Herpes zoster without complication:  -Rash appearance and symptoms are suggestive of herpes zoster, discussed with patient rash can be contagious to non-immune individuals and recommend to avoid contact until rash heals. -Pt  reports intolerance to Valtrex and will change to famciclovir. -Recommend comprehensive eye exam and will place Ophthalmology referral. -Follow up in 2 weeks   Diabetes mellitus without complication: -W2X stable -Continue current medication regimen. -Will place order for new glucometer kit. -Will place Ophthalmology referral for diabetic eye  exam. -Will continue to monitor.  Acute otitis media: -Left ear has improved and recommend to complete antibiotic.   Meds ordered this encounter  Medications   famciclovir (FAMVIR) 500 MG tablet    Sig: Take 1 tablet (500 mg total) by mouth 3 (three) times daily.    Dispense:  21 tablet    Refill:  0    Order Specific Question:   Supervising Provider    Answer:   Beatrice Lecher D [[id number]]   blood glucose meter kit and supplies    Sig: Dispense based on patient and insurance preference. Use up to four times daily as directed. (FOR ICD-10 E10.9, E11.9).  Use to check fasting blood sugars and 2 hrs after largest meal    Dispense:  1 each    Refill:  0    Order Specific Question:   Number of strips    Answer:   100    Order Specific Question:   Number of lancets    Answer:   100   Note:  This note was prepared with assistance of Dragon voice recognition software. Occasional wrong-word or sound-a-like substitutions may have occurred due to the inherent limitations of voice recognition software.   Lorrene Reid, PA-C

## 2020-08-10 NOTE — Telephone Encounter (Signed)
Patient would like to know which ointment the doctor would like her to continue to use. Patient is also wondering if she should schedule the optimology appt and what his name is. Patient was in office this morning.

## 2020-08-10 NOTE — Patient Instructions (Signed)
Shingles  Shingles is an infection. It gives you a painful skin rash and blisters that have fluid in them. Shingles is caused by the same germ (virus) that causes chickenpox. Shingles only happens in people who:  Have had chickenpox.  Have been given a shot of medicine (vaccine) to protect against chickenpox. Shingles is rare in this group. The first symptoms of shingles may be itching, tingling, or pain in an area on your skin. A rash will show on your skin a few days or weeks later. The rash is likely to be on one side of your body. The rash usually has a shape like a belt or a band. Over time, the rash turns into fluid-filled blisters. The blisters will break open, change into scabs, and dry up. Medicines may:  Help with pain and itching.  Help you get better sooner.  Help to prevent long-term problems. Follow these instructions at home: Medicines  Take over-the-counter and prescription medicines only as told by your doctor.  Put on an anti-itch cream or numbing cream where you have a rash, blisters, or scabs. Do this as told by your doctor. Helping with itching and discomfort   Put cold, wet cloths (cold compresses) on the area of the rash or blisters as told by your doctor.  Cool baths can help you feel better. Try adding baking soda or dry oatmeal to the water to lessen itching. Do not bathe in hot water. Blister and rash care  Keep your rash covered with a loose bandage (dressing).  Wear loose clothing that does not rub on your rash.  Keep your rash and blisters clean. To do this, wash the area with mild soap and cool water as told by your doctor.  Check your rash every day for signs of infection. Check for: ? More redness, swelling, or pain. ? Fluid or blood. ? Warmth. ? Pus or a bad smell.  Do not scratch your rash. Do not pick at your blisters. To help you to not scratch: ? Keep your fingernails clean and cut short. ? Wear gloves or mittens when you sleep, if  scratching is a problem. General instructions  Rest as told by your doctor.  Keep all follow-up visits as told by your doctor. This is important.  Wash your hands often with soap and water. If soap and water are not available, use hand sanitizer. Doing this lowers your chance of getting a skin infection caused by germs (bacteria).  Your infection can cause chickenpox in people who have never had chickenpox or never got a shot of chickenpox vaccine. If you have blisters that did not change into scabs yet, try not to touch other people or be around other people, especially: ? Babies. ? Pregnant women. ? Children who have areas of red, itchy, or rough skin (eczema). ? Very old people who have transplants. ? People who have a long-term (chronic) sickness, like cancer or AIDS. Contact a doctor if:  Your pain does not get better with medicine.  Your pain does not get better after the rash heals.  You have any signs of infection in the rash area. These signs include: ? More redness, swelling, or pain around the rash. ? Fluid or blood coming from the rash. ? The rash area feeling warm to the touch. ? Pus or a bad smell coming from the rash. Get help right away if:  The rash is on your face or nose.  You have pain in your face or pain by  your eye.  You lose feeling on one side of your face.  You have trouble seeing.  You have ear pain, or you have ringing in your ear.  You have a loss of taste.  Your condition gets worse. Summary  Shingles gives you a painful skin rash and blisters that have fluid in them.  Shingles is an infection. It is caused by the same germ (virus) that causes chickenpox.  Keep your rash covered with a loose bandage (dressing). Wear loose clothing that does not rub on your rash.  If you have blisters that did not change into scabs yet, try not to touch other people or be around people. This information is not intended to replace advice given to you by  your health care provider. Make sure you discuss any questions you have with your health care provider. Document Revised: 02/19/2019 Document Reviewed: 07/02/2017 Elsevier Patient Education  [place].   Postherpetic Neuralgia Postherpetic neuralgia (PHN) is nerve pain that occurs after a shingles infection. Shingles is a painful rash that appears on one area of the body, usually on the trunk or face. Shingles is caused by the varicella-zoster virus. This is the same virus that causes chickenpox. In people who have had chickenpox, the virus can resurface years later and cause shingles. You may have PHN if you continue to have pain for 4 months after your shingles rash has gone away. PHN appears in the same area where you had the shingles rash. The pain usually goes away after the rash disappears. Getting a vaccination for shingles can prevent PHN. This vaccine is recommended for people older than 60. It may prevent shingles, and may also lower your risk of PHN if you do get shingles. What are the causes? This condition is caused by damage to your nerves from the varicella-zoster virus. The damage makes your nerves overly sensitive. What increases the risk? The following factors may make you more likely to develop this condition:  Being older than 77 years of age.  Having severe pain before your shingles rash starts.  Having a severe rash.  Having shingles in and around the eye area.  Having a disease that makes your body unable to fight infections (weak immune system). What are the signs or symptoms? The main symptom of this condition is pain. The pain may:  Often be very bad and may be described as stabbing, burning, or feeling like an electric shock.  Come and go or may be there all the time.  Be triggered by light touches on the skin or changes in temperature. You may have itching along with the pain. How is this diagnosed? This condition may be diagnosed based on your  symptoms and your history of shingles. Lab studies and other diagnostic tests are usually not needed. How is this treated? There is no cure for this condition. Treatment for PHN will focus on pain relief. Over-the-counter pain relievers do not usually relieve PHN pain. You may need to work with a pain specialist. Treatment may include:  Antidepressant medicines to help with pain and improve sleep.  Anti-seizure medicines to relieve nerve pain.  Strong pain relievers (opioids).  A numbing patch worn on the skin (lidocaine patch).  Botox (botulinum toxin) injections to block pain signals between nerves and muscles.  Injections of numbing medicine or anti-inflammatory medicines around irritated nerves. Follow these instructions at home:   It may take a long time to recover from PHN. Work closely with your health care provider  and develop a good support system at home.  Take over-the-counter and prescription medicines only as told by your health care provider.  Do not drive or use heavy machinery while taking prescription pain medicine.  Wear loose, comfortable clothing.  Cover sensitive areas with a dressing to reduce friction from clothing rubbing on the area.  If directed, put ice on the painful area: ? Put ice in a plastic bag. ? Place a towel between your skin and the bag. ? Leave the ice on for 20 minutes, 2-3 times a day.  Talk to your health care provider if you feel depressed or desperate. Living with long-term pain can be depressing.  Keep all follow-up visits as told by your health care provider. This is important. Contact a health care provider if:  Your medicine is not helping.  You are struggling to manage your pain at home. Summary  Postherpetic neuralgia is a very painful disorder that can occur after an episode of shingles.  The pain is often severe, burning, electric, or stabbing.  Prescription medicines can be helpful in managing persistent  pain.  Getting a vaccination for shingles can prevent PHN. This vaccine is recommended for people older than 60. This information is not intended to replace advice given to you by your health care provider. Make sure you discuss any questions you have with your health care provider. Document Revised: 10/10/2017 Document Reviewed: 01/14/2017 Elsevier Patient Education  [place].

## 2020-08-10 NOTE — Telephone Encounter (Signed)
Advised patient to use mupirocin nasal ointment (BACTROBAN) 2 % [[id number]]  cream.   Advised patient that opthamology office would reach out to her to schedule. AS, CMA

## 2020-08-15 ENCOUNTER — Encounter (HOSPITAL_COMMUNITY): Payer: Self-pay | Admitting: Emergency Medicine

## 2020-08-15 ENCOUNTER — Emergency Department (HOSPITAL_COMMUNITY)
Admission: EM | Admit: 2020-08-15 | Discharge: 2020-08-15 | Disposition: A | Discharge status: Home / Self Care | Payer: Medicare HMO | Attending: Emergency Medicine | Admitting: Emergency Medicine

## 2020-08-15 ENCOUNTER — Other Ambulatory Visit: Payer: Self-pay

## 2020-08-15 ENCOUNTER — Emergency Department (HOSPITAL_COMMUNITY): Payer: Medicare HMO

## 2020-08-15 DIAGNOSIS — B0221 Postherpetic geniculate ganglionitis: Secondary | ICD-10-CM | POA: Diagnosis not present

## 2020-08-15 DIAGNOSIS — R29810 Facial weakness: Secondary | ICD-10-CM | POA: Diagnosis not present

## 2020-08-15 DIAGNOSIS — I10 Essential (primary) hypertension: Secondary | ICD-10-CM | POA: Diagnosis not present

## 2020-08-15 DIAGNOSIS — R519 Headache, unspecified: Secondary | ICD-10-CM | POA: Diagnosis not present

## 2020-08-15 DIAGNOSIS — E119 Type 2 diabetes mellitus without complications: Secondary | ICD-10-CM | POA: Diagnosis not present

## 2020-08-15 LAB — CBC
HCT: 37.8 % (ref 36.0–46.0)
Hemoglobin: 12.9 g/dL (ref 12.0–15.0)
MCH: 30.1 pg (ref 26.0–34.0)
MCHC: 34.1 g/dL (ref 30.0–36.0)
MCV: 88.3 fL (ref 80.0–100.0)
Platelets: 215 10*3/uL (ref 150–400)
RBC: 4.28 MIL/uL (ref 3.87–5.11)
RDW: 12.7 % (ref 11.5–15.5)
WBC: 24.1 10*3/uL — ABNORMAL HIGH (ref 4.0–10.5)
nRBC: 0 % (ref 0.0–0.2)

## 2020-08-15 LAB — BASIC METABOLIC PANEL
Anion gap: 10 (ref 5–15)
BUN: 11 mg/dL (ref 8–23)
CO2: 25 mmol/L (ref 22–32)
Calcium: 9.5 mg/dL (ref 8.9–10.3)
Chloride: 94 mmol/L — ABNORMAL LOW (ref 98–111)
Creatinine, Ser: 0.61 mg/dL (ref 0.44–1.00)
GFR calc non Af Amer: >60 mL/min (ref >60)
Glucose, Bld: 119 mg/dL — ABNORMAL HIGH (ref 70–99)
Potassium: 4.1 mmol/L (ref 3.5–5.1)
Sodium: 129 mmol/L — ABNORMAL LOW (ref 135–145)

## 2020-08-15 MED ORDER — VALACYCLOVIR HCL 1 G PO TABS: 1000.0000 mg | ORAL_TABLET | Freq: Three times a day (TID) | ORAL | 0 refills | Status: AC

## 2020-08-15 MED ORDER — VALACYCLOVIR HCL 500 MG PO TABS
1000.0000 mg | ORAL_TABLET | Freq: Once | ORAL | Status: AC
  Administered 2020-08-15: 1000 mg via ORAL
  Filled 2020-08-15: qty 2

## 2020-08-15 MED ORDER — PREDNISONE 20 MG PO TABS
80.0000 mg | ORAL_TABLET | Freq: Once | ORAL | Status: AC
  Administered 2020-08-15: 80 mg via ORAL
  Filled 2020-08-15: qty 4

## 2020-08-15 MED ORDER — OXYCODONE HCL 5 MG PO TABS: 5.0000 mg | ORAL_TABLET | ORAL | 0 refills | Status: DC | PRN

## 2020-08-15 MED ORDER — PREDNISONE 20 MG PO TABS: 80.0000 mg | ORAL_TABLET | Freq: Every day | ORAL | 0 refills | Status: AC

## 2020-08-15 NOTE — Discharge Instructions (Signed)
You were evaluated in the Emergency Department and after careful evaluation, we did not find any emergent condition requiring admission or further testing in the hospital.  Your exam/testing today was overall reassuring.  Your symptoms seem to be due to [place] syndrome, which is a complication of shingles.  We are going to provide you with a new course of valacyclovir.  Please discard your home prescriptions of valacyclovir and famciclovir.  We are giving you your first dose of prednisone today, please start the prescription for prednisone tomorrow and take for the next 4 days.  You can use the oxycodone medication as needed for pain and discomfort at night.  Tylenol and Motrin are also good medications for pain.  As discussed, your facial weakness may persist for a few weeks.  We recommend a recheck by your primary care doctor in 2 weeks to see how you are healing.  The prednisone medication may increase your blood sugars, please keep a close eye on them.  Please return to the Emergency Department if you experience any worsening of your condition.  Thank you for allowing [place] to be a part of your care.

## 2020-08-15 NOTE — ED Triage Notes (Addendum)
Patient arrives to ED with recent diagnoses of herpes zoster and left ear infection on 9/30 with rash on left side of face and head. On famciclovir for this. Pt states last night at 7pm she noticed a slight headache which woke her up throughout the night. Pt also states that at 7pm yesterday she noticed a a left sided facial droop/paraylsis and is now having a hard time blinking her left eye or moving the left side of her face at all. No weakness, speech or vision abnormalities noted.

## 2020-08-15 NOTE — ED Notes (Signed)
Patient verbalizes understanding of discharge instructions. Opportunity for questioning and answers were provided. Pt discharged from ED. 

## 2020-08-15 NOTE — ED Provider Notes (Signed)
[facility] Emergency Department Provider Note MRN:  [id number]  Arrival date & time: 08/15/20     Chief Complaint   Shingles and Facial Droop   History of Present Illness   Mackenzie Barber is a 79 y.o. year-old female with a history of CLL, hypertension presenting to the ED with chief complaint of shingles and facial droop.  Patient was diagnosed with shingles and has had rash for 2 weeks.  Yesterday she began having facial droop to the left side of the face, the same side that she has a rash on her neck and in her ear.  She was prescribed valacyclovir but only took 2 doses because she was experiencing headaches and attributed it to the medication.  She denies any fever, no numbness or weakness to the arms or legs, no other complaints other than discomfort in the area with the rash, continued ear discomfort.  No eye pain, no hearing loss.  Review of Systems  A complete 10 system review of systems was obtained and all systems are negative except as noted in the HPI and PMH.   Patient's Health History    Past Medical History:  Diagnosis Date   Cancer ([place]) 2015   CLL, no treatment necessary   Diabetes mellitus without complication ([place])    Hyperlipidemia    Hypertension    Ulcerative proctitis ([place])     Past Surgical History:  Procedure Laterality Date   ABDOMINAL HYSTERECTOMY     FLEXIBLE SIGMOIDOSCOPY N/A 05/31/2019   Procedure: FLEXIBLE SIGMOIDOSCOPY;  Surgeon: Mauri Pole, MD;  Location: [facility];  Service: Endoscopy;  Laterality: N/A;   HEMOSTASIS CLIP PLACEMENT  05/31/2019   Procedure: HEMOSTASIS CLIP PLACEMENT;  Surgeon: Mauri Pole, MD;  Location: [facility];  Service: Endoscopy;;   POLYPECTOMY  05/31/2019   Procedure: POLYPECTOMY;  Surgeon: Mauri Pole, MD;  Location: [facility];  Service: Endoscopy;;   TONSILLECTOMY AND ADENOIDECTOMY      Family History  Problem Relation Age of Onset   Cancer Mother         colon   Hyperlipidemia Mother    Hypertension Mother    Colon cancer Mother    Esophageal cancer Neg Hx    Rectal cancer Neg Hx    Stomach cancer Neg Hx     Social History   Socioeconomic History   Marital status: Single    Spouse name: Not on file   Number of children: 2   Years of education: Not on file   Highest education level: Not on file  Occupational History   Occupation: retired  Tobacco Use   Smoking status: Never Smoker   Smokeless tobacco: Never Used  Scientific laboratory technician Use: Never used  Substance and Sexual Activity   Alcohol use: No   Drug use: No   Sexual activity: Never  Other Topics Concern   Not on file  Social History Narrative   Not on file   Social Determinants of Health   Financial Resource Strain:    Difficulty of Paying Living Expenses: Not on file  Food Insecurity:    Worried About Charity fundraiser in the Last Year: Not on file   [employer] of Food in the Last Year: Not on file  Transportation Needs:    Lack of Transportation (Medical): Not on file   Lack of Transportation (Non-Medical): Not on file  Physical Activity:    Days of Exercise per Week: Not on file  Minutes of Exercise per Session: Not on file  Stress:    Feeling of Stress : Not on file  Social Connections:    Frequency of Communication with Friends and Family: Not on file   Frequency of Social Gatherings with Friends and Family: Not on file   Attends Religious Services: Not on file   Active Member of Clubs or Organizations: Not on file   Attends Archivist Meetings: Not on file   Marital Status: Not on file  Intimate Partner Violence:    Fear of Current or Ex-Partner: Not on file   Emotionally Abused: Not on file   Physically Abused: Not on file   Sexually Abused: Not on file     Physical Exam   Vitals:   08/15/20 1345 08/15/20 1401  BP: (!) 169/83 (!) 160/105  Pulse: 65 78  Resp:  16  Temp:  97.6 F (36.4 C)   SpO2: 98% 98%    CONSTITUTIONAL: Well-appearing, NAD NEURO:  Alert and oriented x 3, left facial paralysis involving the periorbital region EYES:  eyes equal and reactive ENT/NECK:  no LAD, no JVD CARDIO: Regular rate, well-perfused, normal S1 and S2 PULM:  CTAB no wheezing or rhonchi GI/GU:  normal bowel sounds, non-distended, non-tender MSK/SPINE:  No gross deformities, no edema SKIN: Excoriated rash to the left trapezius and left ear in a dermatomal distribution PSYCH:  Appropriate speech and behavior  *Additional and/or pertinent findings included in MDM below  Diagnostic and Interventional Summary    EKG Interpretation  Date/Time:  Tuesday August 15 2020 09:20:19 EDT Ventricular Rate:  63 PR Interval:  184 QRS Duration: 82 QT Interval:  402 QTC Calculation: 411 R Axis:   47 Text Interpretation: Normal sinus rhythm Cannot rule out Anterior infarct , age undetermined Abnormal ECG No old tracing to compare Confirmed by Calvert Cantor [phone]) on 08/15/2020 10:19:07 AM      Labs Reviewed  BASIC METABOLIC PANEL - Abnormal; Notable for the following components:      Result Value   Sodium 129 (*)    Chloride 94 (*)    Glucose, Bld 119 (*)    All other components within normal limits  CBC - Abnormal; Notable for the following components:   WBC 24.1 (*)    All other components within normal limits  URINALYSIS, ROUTINE W REFLEX MICROSCOPIC  CBG MONITORING, ED    CT HEAD WO CONTRAST  Final Result      Medications  predniSONE (DELTASONE) tablet 80 mg (80 mg Oral Given 08/15/20 1401)  valACYclovir (VALTREX) tablet 1,000 mg (1,000 mg Oral Given 08/15/20 1401)     Procedures  /  Critical Care Procedures  ED Course and Medical Decision Making  I have reviewed the triage vital signs, the nursing notes, and pertinent available records from the EMR.  Listed above are laboratory and imaging tests that I personally ordered, reviewed, and interpreted and then considered in  my medical decision making (see below for details).  History and exam consistent with Ramsay Hunt syndrome, will treat with valacyclovir and prednisone.       Barth Kirks. 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  Your exam/testing today was overall reassuring.  Your symptoms seem to be due to [place] syndrome, which is a complication of shingles.  We are going to provide you with a new course of valacyclovir.  Please discard your home prescriptions of valacyclovir and famciclovir.  We are giving you your first dose of prednisone today, please start the prescription for prednisone tomorrow and take for the next 4 days.  You can use the oxycodone medication as needed for pain and discomfort at night.  Tylenol and Motrin are also good medications for pain.  As discussed, your facial weakness may persist for a few weeks.  We recommend a recheck by your primary care doctor in 2 weeks to see how you are healing.  The prednisone medication may increase your blood sugars, please keep a close eye on them.  Please return to the Emergency Department if you experience any worsening of your condition.  Thank you for allowing [place] to be a part of your care.       Maudie Flakes, MD 08/15/20 1409

## 2020-08-29 ENCOUNTER — Other Ambulatory Visit: Payer: Self-pay | Admitting: Physician Assistant

## 2020-08-29 ENCOUNTER — Other Ambulatory Visit: Payer: Self-pay

## 2020-08-29 ENCOUNTER — Ambulatory Visit (INDEPENDENT_AMBULATORY_CARE_PROVIDER_SITE_OTHER): Payer: Medicare HMO | Admitting: Physician Assistant

## 2020-08-29 ENCOUNTER — Encounter: Payer: Self-pay | Admitting: Physician Assistant

## 2020-08-29 VITALS — BP 146/73 | HR 63 | Temp 98.0°F | Ht 65.5 in | Wt 163.1 lb

## 2020-08-29 DIAGNOSIS — I10 Essential (primary) hypertension: Secondary | ICD-10-CM

## 2020-08-29 DIAGNOSIS — E119 Type 2 diabetes mellitus without complications: Secondary | ICD-10-CM

## 2020-08-29 DIAGNOSIS — H9202 Otalgia, left ear: Secondary | ICD-10-CM | POA: Diagnosis not present

## 2020-08-29 DIAGNOSIS — B028 Zoster with other complications: Secondary | ICD-10-CM | POA: Diagnosis not present

## 2020-08-29 DIAGNOSIS — B0221 Postherpetic geniculate ganglionitis: Secondary | ICD-10-CM

## 2020-08-29 NOTE — Progress Notes (Signed)
Acute Office Visit  Subjective:    Patient ID: Mackenzie Barber, female    DOB: October 20, 1941, 79 y.o.   MRN: [id number]  Chief Complaint  Patient presents with  . Follow-up    shingles    HPI Patient is in today for follow up on herpes zoster. 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 Patient has completed antiviral treatment (Valtrex). Reports is doing better. Continues to have a headache which improves after taking Tylenol. Reports blurry vision and intermittent drainge from left eye. Continues to have throbbing left ear pain, especially when exposed to cold temperatures.   Past Medical History:  Diagnosis Date  . Cancer ([place]) 2015   CLL, no treatment necessary  . Diabetes mellitus without complication ([place])   . Hyperlipidemia   . Hypertension   . Ulcerative proctitis [facility])     Past Surgical History:  Procedure Laterality Date  . ABDOMINAL HYSTERECTOMY    . FLEXIBLE SIGMOIDOSCOPY N/A 05/31/2019   Procedure: FLEXIBLE SIGMOIDOSCOPY;  Surgeon: Mauri Pole, MD;  [facility];  Service: Endoscopy;  Laterality: N/A;  . HEMOSTASIS CLIP PLACEMENT  05/31/2019   Procedure: HEMOSTASIS CLIP PLACEMENT;  Surgeon: Mauri Pole, MD;  [facility];  Service: Endoscopy;;  . POLYPECTOMY  05/31/2019   Procedure: POLYPECTOMY;  Surgeon: Mauri Pole, MD;  [facility];  Service: Endoscopy;;  . TONSILLECTOMY AND ADENOIDECTOMY      Family History  Problem Relation Age of Onset  . Cancer Mother        colon  . Hyperlipidemia Mother   . Hypertension Mother   . Colon cancer Mother   . Esophageal cancer Neg Hx   . Rectal cancer Neg Hx   . Stomach cancer Neg Hx     Social History   Socioeconomic History  . Marital status: Single    Spouse name: Not on file  . Number of children: 2  . Years of education: Not on file  . Highest  education level: Not on file  Occupational History  . Occupation: retired  Tobacco Use  . Smoking status: Never Smoker  . Smokeless tobacco: Never Used  Vaping Use  . Vaping Use: Never used  Substance and Sexual Activity  . Alcohol use: No  . Drug use: No  . Sexual activity: Never  Other Topics Concern  . Not on file  Social History Narrative  . Not on file   Social Determinants of Health   Financial Resource Strain:   . Difficulty of Paying Living Expenses: Not on file  Food Insecurity:   . Worried About Charity fundraiser in the Last Year: Not on file  . Ran Out of Food in the Last Year: Not on file  Transportation Needs:   . Lack of Transportation (Medical): Not on file  . Lack of Transportation (Non-Medical): Not on file  Physical Activity:   . Days of Exercise per Week: Not on file  . Minutes of Exercise per Session: Not on file  Stress:   . Feeling of Stress : Not on file  Social Connections:   . Frequency of Communication with Friends and Family: Not on file  . Frequency of Social Gatherings with Friends and Family: Not on file  . Attends Religious Services: Not on file  . Active Member of Clubs or Organizations: Not on file  . Attends Club or  Organization Meetings: Not on file  . Marital Status: Not on file  Intimate Partner Violence:   . Fear of Current or Ex-Partner: Not on file  . Emotionally Abused: Not on file  . Physically Abused: Not on file  . Sexually Abused: Not on file    Outpatient Medications Prior to Visit  Medication Sig Dispense Refill  . Accu-Chek Softclix Lancets lancets Use to check blood sugars every morning fasting and 2 hours after largest meal 200 each 12  . acetaminophen (TYLENOL) 500 MG tablet Take 1,000 mg by mouth every 6 (six) hours as needed (pain.).    Marland Kitchen Alcohol Swabs (B-D SINGLE USE SWABS REGULAR) PADS 1 Package by Does not apply route 2 (two) times daily. 100 each 0  . amoxicillin-clavulanate (AUGMENTIN) 875-125 MG tablet  Take 1 tablet by mouth 2 (two) times daily. 20 tablet 0  . atorvastatin (LIPITOR) 20 MG tablet Take 1 tablet (20 mg total) by mouth daily. 90 tablet 1  . Blood Glucose Calibration (ACCU-CHEK AVIVA) SOLN 1 drop by In Vitro route 2 (two) times daily. 1 each 11  . blood glucose meter kit and supplies Dispense based on patient and insurance preference. Use up to four times daily as directed. (FOR ICD-10 E10.9, E11.9).  Use to check fasting blood sugars and 2 hrs after largest meal 1 each 0  . glucose blood test strip Accuchek Aviva Test strips Use to check blood sugars every morning fasting and 2 hours after largest meal 200 each 12  . hydrochlorothiazide (HYDRODIURIL) 25 MG tablet TAKE 1 TABLET (25 MG TOTAL) BY MOUTH DAILY. 90 tablet 1  . hydrocortisone (ANUSOL-HC) 25 MG suppository Place 25 mg rectally at bedtime.     . mesalamine (LIALDA) 1.2 g EC tablet Take 4 tablets (4.8 g total) by mouth daily with breakfast. 360 tablet 3  . oxyCODONE (ROXICODONE) 5 MG immediate release tablet Take 1 tablet (5 mg total) by mouth every 4 (four) hours as needed for severe pain. 8 tablet 0  . pseudoephedrine-acetaminophen (TYLENOL SINUS) 30-500 MG TABS tablet Take 1 tablet by mouth every 4 (four) hours as needed.    . vitamin B-12 (CYANOCOBALAMIN) 1000 MCG tablet Take 1 tablet (1,000 mcg total) by mouth daily. 90 tablet 1  . Vitamin D, Ergocalciferol, (DRISDOL) 1.25 MG (50000 UT) CAPS capsule Take 1 capsule (50,000 Units total) by mouth once a week. Swallow whole.  Do NOT chew or crush. (either Sundays or Mondays) 12 capsule 3  . losartan (COZAAR) 100 MG tablet Take 1 tablet (100 mg total) by mouth daily. 90 tablet 1  . metFORMIN (GLUCOPHAGE) 500 MG tablet TAKE 1/2 TABLET TWICE DAILY WITH A MEAL 90 tablet 1  . famciclovir (FAMVIR) 500 MG tablet Take 1 tablet (500 mg total) by mouth 3 (three) times daily. 21 tablet 0  . ibuprofen (ADVIL) 800 MG tablet Take 1 tablet (800 mg total) by mouth every 8 (eight) hours as  needed. 30 tablet 0  . mupirocin nasal ointment (BACTROBAN) 2 % Apply in each nostril daily 1 g 0  . vitamin B-12 (CYANOCOBALAMIN) 1000 MCG tablet 1,000 mcg.     No facility-administered medications prior to visit.    No Known Allergies  Review of Systems A fourteen system review of systems was performed and found to be positive as per HPI.    Objective:    Physical Exam General:  Well Developed, well nourished, appropriate for stated age.  Neuro:  Alert and oriented,  extra-ocular muscles intact,  facial dropping present  HEENT:  Normocephalic, atraumatic, clear drainage noted of left eye Skin:  Rash behind ear and scalp have healed, two smaller lesions on side of neck have crusted, no new vesicular lesions noted Cardiac:  RRR, S1 S2 Respiratory:  ECTA B/L and A/P, Not using accessory muscles, speaking in full sentences- unlabored. Vascular:  Ext warm, no cyanosis apprec.; cap RF less 2 sec. Psych:  No HI/SI, judgement and insight good, Euthymic mood. Full Affect.   BP (!) 146/73   Pulse 63   Temp 98 F (36.7 C) (Oral)   Ht 5' 5.5" (1.664 m)   Wt 163 lb 1.6 oz (74 kg)   SpO2 96% Comment: on RA  BMI 26.73 kg/m  Wt Readings from Last 3 Encounters:  08/29/20 163 lb 1.6 oz (74 kg)  08/15/20 164 lb (74.4 kg)  08/10/20 167 lb 3.2 oz (75.8 kg)    Health Maintenance Due  Topic Date Due  . OPHTHALMOLOGY EXAM  Never done  . COVID-19 Vaccine (1) Never done  . TETANUS/TDAP  Never done  . DEXA SCAN  Never done  . PNA vac Low Risk Adult (1 of 2 - PCV13) Never done  . FOOT EXAM  05/06/2019  . INFLUENZA VACCINE  06/11/2020    There are no preventive care reminders to display for this patient.   Lab Results  Component Value Date   TSH 2.130 09/03/2019   Lab Results  Component Value Date   WBC 24.1 (H) 08/15/2020   HGB 12.9 08/15/2020   HCT 37.8 08/15/2020   MCV 88.3 08/15/2020   PLT 215 08/15/2020   Lab Results  Component Value Date   NA 129 (L) 08/15/2020   K 4.1  08/15/2020   CO2 25 08/15/2020   GLUCOSE 119 (H) 08/15/2020   BUN 11 08/15/2020   CREATININE 0.61 08/15/2020   BILITOT 0.4 06/30/2020   ALKPHOS 61 06/30/2020   AST 30 06/30/2020   ALT 33 06/30/2020   PROT 6.9 06/30/2020   ALBUMIN 4.5 06/30/2020   CALCIUM 9.5 08/15/2020   ANIONGAP 10 08/15/2020   GFR 75.13 01/28/2019   Lab Results  Component Value Date   CHOL 152 03/14/2020   Lab Results  Component Value Date   HDL 48 03/14/2020   Lab Results  Component Value Date   LDLCALC 72 03/14/2020   Lab Results  Component Value Date   TRIG 194 (H) 03/14/2020   Lab Results  Component Value Date   CHOLHDL 3.2 03/14/2020   Lab Results  Component Value Date   HGBA1C 6.6 (A) 08/10/2020       Assessment & Plan:   Problem List Items Addressed This Visit      Nervous and Auditory   Ramsay Hunt auricular syndrome   Relevant Orders   Ambulatory referral to Ophthalmology    Other Visit Diagnoses    Herpes zoster with complication    -  Primary   Relevant Orders   Ambulatory referral to Ophthalmology   Ambulatory referral to ENT   Left ear pain       Relevant Orders   Ambulatory referral to ENT     Herpes Zoster with complication, Ramsay Hunt auricular syndrome, Left ear pain: -Patient has completed antiviral and prednisone treatment, symptoms have improved but continues to have otalgia and vision symptoms so will place referrals to ophthalmology and ENT. Patient could possibly benefit from continued antiviral treatment depending on specialty evaluations. -Continue Tylenol as needed for pain.  No orders of the defined types were placed in this encounter.  Note:  This note was prepared with assistance of Dragon voice recognition software. Occasional wrong-word or sound-a-like substitutions may have occurred due to the inherent limitations of voice recognition software.   Lorrene Reid, PA-C

## 2020-08-29 NOTE — Patient Instructions (Addendum)
Ramsay Hunt Syndrome  Ramsay Hunt syndrome (RHS) is a viral infection that affects the nerves in the face and the nerves near the inner ear. The infection is caused by the varicella zoster virus (VZV). This is the same virus that causes chickenpox and shingles. After a person has chickenpox, this virus may become inactive. Years later, the virus can become active again and cause Ramsay Hunt syndrome. The trigger may be something that weakens the body's defense system (immune system), like stress. When VZV becomes activated, it moves up the facial nerve and causes a painful rash in or around the ear canal. It may also travel up the nerve that is responsible for hearing. Ramsay Hunt syndrome cannot be passed from person to person (it is not contagious). However, if a person who has never had chickenpox comes in contact with fluid from someone's skin blisters, he or she may develop chickenpox. What are the causes? This condition is caused by the varicella zoster virus. What increases the risk? You may be at risk for Ramsay Hunt syndrome if you have had chickenpox in the past. What are the signs or symptoms? Symptoms of this condition include:  A rash with blisters that breaks out around the ear. The rash may be accompanied by: ? A rash in the inner ear, along the side of the face, or up the scalp. ? A rash inside the mouth. ? Deep and severe pain in the ear. ? Severe, burning pain wherever the rash develops.  Facial nerve weakness. This may cause: ? Drooping on one side of the face. ? Inability to close the eyelid on the affected side of the face. ? Trouble eating. ? Loss of ability to taste on the side of the tongue. If RHS affects the inner ear nerve (auditory nerve), other symptoms may be present. These may include:  Hearing loss.  A spinning sensation (vertigo).  Clumsiness.  Ringing in the ear (tinnitus). How is this diagnosed? This condition may be diagnosed based on:  Your  symptoms.  A physical exam.  Other tests to confirm the diagnosis. These may include: ? Viral culture. This test is done by swabbing the rash or blister to check for VZV. ? Blood tests to check for antibodies to VZV. Antibodies are proteins that your body produces in response to germs. ? Nerve conduction studies (electroneurogram). ? MRI scan. ? Hearing tests (audiology). How is this treated? This condition may be treated with:  An antiviral medicine to treat the virus.  NSAIDs or prescription pain relievers to control pain.  An anti-inflammatory medicine (steroid) called prednisone.  Antibiotic medicine, if the rash becomes infected. If treatment starts within the first 3 days of having symptoms, it will shorten the course of the pain and rash that is caused by RHS. Treatment will also prevent your facial nerve from continuing to weaken. Without treatment, it is possible that you may not recover full use of your facial nerve. Follow these instructions at home: Medicines  Take over-the-counter and prescription medicines only as told by your health care provider.  If you were prescribed an antibiotic or antiviral medicine, take or apply it as told by your health care provider. Do not stop using the antibiotic or antiviral medicine even if your condition improves.  Do not drive or use heavy machinery while taking prescription pain medicine.  If you are taking prescription pain medicine, take actions to prevent or treat constipation. Your health care provider may recommend that you: ? Drink enough fluid  to keep your urine pale yellow. ? Eat foods that are high in fiber, such as fresh fruits and vegetables, whole grains, and beans. ? Limit foods that are high in fat and processed sugars, such as fried or sweet foods. ? Take an over-the-counter or prescription medicine for constipation. General instructions  If told by your health care provider, use artificial tears and wear an eye  patch to protect your eye until you can close your eyelid again.  Do not scratch or pick at the rash.  Put a cold, wet cloth (cold compress) on the itchy area as told by your health care provider.  If you have blisters in your mouth, do not eat or drink spicy, salty, or acidic things. Soft, bland, and cold foods and beverages are easiest to swallow.  Keep all follow-up visits as told by your health care provider. This is important. Contact a health care provider if:  Your pain medicine is not helping.  You have chills or fever.  Your symptoms get worse.  Your symptoms have not gone away after 2 weeks.  You have any changes in vision. Summary  Ramsay Hunt syndrome (RHS) is a viral infection that affects the nerves in the face and the nerves near the inner ear. The infection is caused by the varicella zoster virus (VZV), which also causes chicken pox and shingles.  After a person has chickenpox, this virus may become inactive. If the inactive VZV virus becomes activated, it moves up the facial nerve and causes a painful rash in or around the ear canal. It can also cause hearing loss and facial nerve weakness, with drooping on one side of the face.  If treatment starts within the first 3 days of having symptoms, it will shorten the course of the pain and rash that are caused by RHS. Treatment will also prevent your facial nerve from continuing to weaken.  Treatments may include antiviral, steroid, and pain medicines. This information is not intended to replace advice given to you by your health care provider. Make sure you discuss any questions you have with your health care provider. Document Revised: 12/24/2018 Document Reviewed: 12/11/2017 Elsevier Patient Education  2020 [employer].

## 2020-08-31 ENCOUNTER — Ambulatory Visit: Payer: Medicare HMO | Admitting: Physician Assistant

## 2020-08-31 DIAGNOSIS — H02234 Paralytic lagophthalmos left upper eyelid: Secondary | ICD-10-CM | POA: Diagnosis not present

## 2020-09-26 ENCOUNTER — Other Ambulatory Visit: Payer: Self-pay | Admitting: Family Medicine

## 2020-09-26 DIAGNOSIS — E559 Vitamin D deficiency, unspecified: Secondary | ICD-10-CM

## 2020-09-27 DIAGNOSIS — H02234 Paralytic lagophthalmos left upper eyelid: Secondary | ICD-10-CM | POA: Diagnosis not present

## 2020-09-28 ENCOUNTER — Telehealth: Payer: Self-pay | Admitting: Physician Assistant

## 2020-09-28 DIAGNOSIS — E559 Vitamin D deficiency, unspecified: Secondary | ICD-10-CM

## 2020-09-28 MED ORDER — VITAMIN D (ERGOCALCIFEROL) 1.25 MG (50000 UNIT) PO CAPS: 50000.0000 [IU] | ORAL_CAPSULE | ORAL | 0 refills | Status: DC

## 2020-09-28 NOTE — Telephone Encounter (Signed)
Vit D sent to pharmacy. Patient is aware she can get flu shot now. AS< CMA

## 2020-09-28 NOTE — Telephone Encounter (Signed)
Patient needs refill on Vitamin-D.  Please send to [facility].  Also, patient is wanting to get her flu shot.  She had shingles a month ago.  When can she get the flu shot?  Please Advise.   Vitamin D, Ergocalciferol, (DRISDOL) 1.25 MG (50000 UT) CAPS capsule [[id number]]   Order Details Dose: 50,000 Units Route: Oral Frequency: Weekly  Dispense Quantity: 12 capsule Refills: 3       Sig: Take 1 capsule (50,000 Units total) by mouth once a week. Swallow whole. Do NOT chew or crush. (either Sundays or Mondays)      Start Date: 09/10/19 End Date: --  Written Date: 09/10/19 Expiration Date: 09/09/20    Diagnosis Association: Vitamin D insufficiency (E55.9)  Original Order:  Vitamin D, Ergocalciferol, (DRISDOL) 1.25 MG (50000 UT) CAPS capsule [[id number]]  Providers  Ordering and Authorizing Provider: Mellody Dance, DO NPI: [id number]  DEA #: [id number]  Ordering User:  Fonnie Mu, [place], [place]  [address] [place] [address]  Phone:  [phone] Fax:  [phone]

## 2020-09-28 NOTE — Telephone Encounter (Signed)
Patient added to [place] schedule for flu vaccine. AS< CMA

## 2020-10-02 ENCOUNTER — Ambulatory Visit (INDEPENDENT_AMBULATORY_CARE_PROVIDER_SITE_OTHER): Payer: Medicare HMO | Admitting: Physician Assistant

## 2020-10-02 ENCOUNTER — Other Ambulatory Visit: Payer: Self-pay

## 2020-10-02 ENCOUNTER — Encounter: Payer: Self-pay | Admitting: Physician Assistant

## 2020-10-02 VITALS — Temp 98.1°F | Ht 65.5 in | Wt 163.0 lb

## 2020-10-02 DIAGNOSIS — Z23 Encounter for immunization: Secondary | ICD-10-CM

## 2020-10-02 NOTE — Progress Notes (Signed)
Patient tolerated vaccine well. VIS given. AS, CMA  Noted. Lorrene Reid, PA-C

## 2020-10-13 ENCOUNTER — Ambulatory Visit (INDEPENDENT_AMBULATORY_CARE_PROVIDER_SITE_OTHER): Payer: Medicare HMO | Admitting: Otolaryngology

## 2020-11-03 ENCOUNTER — Other Ambulatory Visit: Payer: Self-pay | Admitting: Physician Assistant

## 2020-11-03 DIAGNOSIS — I10 Essential (primary) hypertension: Secondary | ICD-10-CM

## 2020-11-03 DIAGNOSIS — E119 Type 2 diabetes mellitus without complications: Secondary | ICD-10-CM

## 2020-12-04 ENCOUNTER — Other Ambulatory Visit: Payer: Self-pay | Admitting: Physician Assistant

## 2020-12-04 DIAGNOSIS — Z1231 Encounter for screening mammogram for malignant neoplasm of breast: Secondary | ICD-10-CM

## 2020-12-26 ENCOUNTER — Other Ambulatory Visit: Payer: Self-pay | Admitting: Physician Assistant

## 2020-12-26 ENCOUNTER — Telehealth: Payer: Self-pay | Admitting: Physician Assistant

## 2020-12-26 DIAGNOSIS — E559 Vitamin D deficiency, unspecified: Secondary | ICD-10-CM

## 2020-12-26 NOTE — Telephone Encounter (Signed)
Patient called in asking about a recent referral to [facility]. According to patient, the office [facility]) is saying they have already given the diabetic eye exam to patient. She is wondering did they do? She also mentioned a prescription about new glasses and if she needs them. Thanks

## 2020-12-26 NOTE — Telephone Encounter (Signed)
Patient states she has spoke with [facility] and straightened the issue out? AS, CMA

## 2020-12-28 DIAGNOSIS — H2513 Age-related nuclear cataract, bilateral: Secondary | ICD-10-CM | POA: Diagnosis not present

## 2020-12-28 LAB — HM DIABETES EYE EXAM

## 2021-01-01 ENCOUNTER — Other Ambulatory Visit: Payer: Self-pay

## 2021-01-01 ENCOUNTER — Ambulatory Visit (INDEPENDENT_AMBULATORY_CARE_PROVIDER_SITE_OTHER): Payer: Medicare HMO | Admitting: Physician Assistant

## 2021-01-01 ENCOUNTER — Encounter: Payer: Self-pay | Admitting: Physician Assistant

## 2021-01-01 VITALS — BP 128/70 | HR 72 | Temp 96.3°F | Ht 65.5 in | Wt 173.5 lb

## 2021-01-01 DIAGNOSIS — C9110 Chronic lymphocytic leukemia of B-cell type not having achieved remission: Secondary | ICD-10-CM | POA: Diagnosis not present

## 2021-01-01 DIAGNOSIS — I152 Hypertension secondary to endocrine disorders: Secondary | ICD-10-CM | POA: Diagnosis not present

## 2021-01-01 DIAGNOSIS — E119 Type 2 diabetes mellitus without complications: Secondary | ICD-10-CM

## 2021-01-01 DIAGNOSIS — E1159 Type 2 diabetes mellitus with other circulatory complications: Secondary | ICD-10-CM | POA: Diagnosis not present

## 2021-01-01 LAB — POCT GLYCOSYLATED HEMOGLOBIN (HGB A1C): Hemoglobin A1C: 6.7 % — AB (ref 4.0–5.6)

## 2021-01-01 NOTE — Patient Instructions (Signed)

## 2021-01-01 NOTE — Assessment & Plan Note (Signed)
-  Controlled. -Continue current medication regimen. -Follow low sodium diet and stay well hydrated. -Recent CMP renal function and electrolytes wnl

## 2021-01-01 NOTE — Assessment & Plan Note (Signed)
-  A1c today 6.7, controlled. -Continue current medication regimen. -Follow low carbohydrate and glucose diet. -Will continue to monitor.

## 2021-01-01 NOTE — Assessment & Plan Note (Addendum)
-  Followed by Hematology/Oncology. -Last CBC stable.

## 2021-01-01 NOTE — Progress Notes (Signed)
Established Patient Office Visit  Subjective:  Patient ID: Mackenzie Barber, female    DOB: 11-03-1941  Age: 80 y.o. MRN: [id number]  CC:  Chief Complaint  Patient presents with  . Diabetes  . Hypertension  . Hyperlipidemia    HPI Mackenzie Barber presents for follow up on diabetes mellitus and hypertension. Has no acute concerns.  Diabetes mellitus: Pt denies increased urination or thirst. Pt reports medication compliance. No hypoglycemic events. Checking glucose at home. Reports FBS <140. Tires to watch carbohydrates and sweets.  HTN: Pt denies chest pain, palpitations, dizziness or increased lower extremity swelling. Taking medication as directed without side effects. Checks BP at home few times a week and recently obtained new BP device from insurance so needs to get familiar with how to use it. Pt follows a low salt diet. Tries to stay hydrated.  CLL: States sees hem/onc once annually.    Past Medical History:  Diagnosis Date  . Cancer ([place]) 2015   CLL, no treatment necessary  . Diabetes mellitus without complication ([place])   . Hyperlipidemia   . Hypertension   . Ulcerative proctitis [facility])     Past Surgical History:  Procedure Laterality Date  . ABDOMINAL HYSTERECTOMY    . FLEXIBLE SIGMOIDOSCOPY N/A 05/31/2019   Procedure: FLEXIBLE SIGMOIDOSCOPY;  Surgeon: Mauri Pole, MD;  Location: [facility]COPY;  Service: Endoscopy;  Laterality: N/A;  . HEMOSTASIS CLIP PLACEMENT  05/31/2019   Procedure: HEMOSTASIS CLIP PLACEMENT;  Surgeon: Mauri Pole, MD;  Location: [facility]OSCOPY;  Service: Endoscopy;;  . POLYPECTOMY  05/31/2019   Procedure: POLYPECTOMY;  Surgeon: Mauri Pole, MD;  Location: [facility] ENDOSCOPY;  Service: Endoscopy;;  . TONSILLECTOMY AND ADENOIDECTOMY      Family History  Problem Relation Age of Onset  . Cancer Mother        colon  . Hyperlipidemia Mother   . Hypertension Mother   . Colon cancer Mother   . Esophageal cancer Neg Hx   . Rectal  cancer Neg Hx   . Stomach cancer Neg Hx     Social History   Socioeconomic History  . Marital status: Single    Spouse name: Not on file  . Number of children: 2  . Years of education: Not on file  . Highest education level: Not on file  Occupational History  . Occupation: retired  Tobacco Use  . Smoking status: Never Smoker  . Smokeless tobacco: Never Used  Vaping Use  . Vaping Use: Never used  Substance and Sexual Activity  . Alcohol use: No  . Drug use: No  . Sexual activity: Never  Other Topics Concern  . Not on file  Social History Narrative  . Not on file   Social Determinants of Health   Financial Resource Strain: Not on file  Food Insecurity: Not on file  Transportation Needs: Not on file  Physical Activity: Not on file  Stress: Not on file  Social Connections: Not on file  Intimate Partner Violence: Not on file    Outpatient Medications Prior to Visit  Medication Sig Dispense Refill  . Accu-Chek Softclix Lancets lancets Use to check blood sugars every morning fasting and 2 hours after largest meal 200 each 12  . acetaminophen (TYLENOL) 500 MG tablet Take 1,000 mg by mouth every 6 (six) hours as needed (pain.).    Marland Kitchen Alcohol Swabs (B-D SINGLE USE SWABS REGULAR) PADS 1 Package by Does not apply route 2 (two) times daily. 100 each 0  .  atorvastatin (LIPITOR) 20 MG tablet Take 1 tablet (20 mg total) by mouth daily. 90 tablet 1  . Blood Glucose Calibration (ACCU-CHEK AVIVA) SOLN 1 drop by In Vitro route 2 (two) times daily. 1 each 11  . blood glucose meter kit and supplies Dispense based on patient and insurance preference. Use up to four times daily as directed. (FOR ICD-10 E10.9, E11.9).  Use to check fasting blood sugars and 2 hrs after largest meal 1 each 0  . glucose blood test strip Accuchek Aviva Test strips Use to check blood sugars every morning fasting and 2 hours after largest meal 200 each 12  . hydrochlorothiazide (HYDRODIURIL) 25 MG tablet TAKE 1  TABLET (25 MG TOTAL) BY MOUTH DAILY. 90 tablet 1  . hydrocortisone (ANUSOL-HC) 25 MG suppository Place 25 mg rectally at bedtime.     Marland Kitchen losartan (COZAAR) 100 MG tablet TAKE 1 TABLET (100 MG TOTAL) BY MOUTH DAILY. 90 tablet 0  . mesalamine (LIALDA) 1.2 g EC tablet Take 4 tablets (4.8 g total) by mouth daily with breakfast. 360 tablet 3  . metFORMIN (GLUCOPHAGE) 500 MG tablet TAKE 1/2 TABLET TWICE DAILY WITH A MEAL 90 tablet 0  . pseudoephedrine-acetaminophen (TYLENOL SINUS) 30-500 MG TABS tablet Take 1 tablet by mouth every 4 (four) hours as needed.    . Vitamin D, Ergocalciferol, (DRISDOL) 1.25 MG (50000 UNIT) CAPS capsule TAKE 1 CAPSULE (50,000 UNITS TOTAL) BY MOUTH ONCE A WEEK. SWALLOW WHOLE. DO NOT CHEW OR CRUSH. (EITHER SUNDAYS OR MONDAYS) 12 capsule 0  . vitamin B-12 (CYANOCOBALAMIN) 1000 MCG tablet Take 1 tablet (1,000 mcg total) by mouth daily. (Patient not taking: Reported on 01/01/2021) 90 tablet 1  . amoxicillin-clavulanate (AUGMENTIN) 875-125 MG tablet Take 1 tablet by mouth 2 (two) times daily. (Patient not taking: Reported on 01/01/2021) 20 tablet 0  . oxyCODONE (ROXICODONE) 5 MG immediate release tablet Take 1 tablet (5 mg total) by mouth every 4 (four) hours as needed for severe pain. (Patient not taking: Reported on 01/01/2021) 8 tablet 0   No facility-administered medications prior to visit.    No Known Allergies  ROS Review of Systems   A fourteen system review of systems was performed and found to be positive as per HPI.   Objective:    Physical Exam General:  Well Developed, well nourished, appropriate for stated age.  Neuro:  Alert and oriented,  extra-ocular muscles intact  HEENT:  Normocephalic, atraumatic, neck supple Skin:  no gross rash, warm, pink. Cardiac:  RRR, S1 S2 wnl's Respiratory:  ECTA B/L w/o wheezing, Not using accessory muscles, speaking in full sentences- unlabored. Vascular:  Ext warm, no cyanosis apprec.; Varicosities noted. Gross edema  Psych:  No  HI/SI, judgement and insight good, Euthymic mood. Full Affect.  BP 128/70   Pulse 72   Temp (!) 96.3 F (35.7 C)   Ht 5' 5.5" (1.664 m)   Wt 173 lb 8 oz (78.7 kg)   SpO2 96%   BMI 28.43 kg/m  Wt Readings from Last 3 Encounters:  01/01/21 173 lb 8 oz (78.7 kg)  10/02/20 163 lb (73.9 kg)  08/29/20 163 lb 1.6 oz (74 kg)     Health Maintenance Due  Topic Date Due  . OPHTHALMOLOGY EXAM  Never done  . COVID-19 Vaccine (1) Never done  . TETANUS/TDAP  Never done  . DEXA SCAN  Never done  . PNA vac Low Risk Adult (1 of 2 - PCV13) Never done  . FOOT EXAM  05/06/2019  There are no preventive care reminders to display for this patient.  Lab Results  Component Value Date   TSH 2.130 09/03/2019   Lab Results  Component Value Date   WBC 24.1 (H) 08/15/2020   HGB 12.9 08/15/2020   HCT 37.8 08/15/2020   MCV 88.3 08/15/2020   PLT 215 08/15/2020   Lab Results  Component Value Date   NA 129 (L) 08/15/2020   K 4.1 08/15/2020   CO2 25 08/15/2020   GLUCOSE 119 (H) 08/15/2020   BUN 11 08/15/2020   CREATININE 0.61 08/15/2020   BILITOT 0.4 06/30/2020   ALKPHOS 61 06/30/2020   AST 30 06/30/2020   ALT 33 06/30/2020   PROT 6.9 06/30/2020   ALBUMIN 4.5 06/30/2020   CALCIUM 9.5 08/15/2020   ANIONGAP 10 08/15/2020   GFR 75.13 01/28/2019   Lab Results  Component Value Date   CHOL 152 03/14/2020   Lab Results  Component Value Date   HDL 48 03/14/2020   Lab Results  Component Value Date   LDLCALC 72 03/14/2020   Lab Results  Component Value Date   TRIG 194 (H) 03/14/2020   Lab Results  Component Value Date   CHOLHDL 3.2 03/14/2020   Lab Results  Component Value Date   HGBA1C 6.7 (A) 01/01/2021      Assessment & Plan:   Problem List Items Addressed This Visit      Cardiovascular and Mediastinum   Hypertension associated with diabetes ([place]) (Chronic)    -Controlled. -Continue current medication regimen. -Follow low sodium diet and stay well  hydrated. -Recent CMP renal function and electrolytes wnl        Endocrine   Diabetes mellitus without complication ([facility]) - Primary (Chronic)    -A1c today 6.7, controlled. -Continue current medication regimen. -Follow low carbohydrate and glucose diet. -Will continue to monitor.      Relevant Orders   POCT glycosylated hemoglobin (Hb A1C) (Completed)     Other   CLL (chronic lymphocytic leukemia) (HCC) (Chronic)    -Followed by Hematology/Oncology. -Last CBC stable.         No orders of the defined types were placed in this encounter.   Follow-up: Return in about 3 months (around 03/31/2021) for [place] [address] [place].    Lorrene Reid, PA-C

## 2021-01-10 ENCOUNTER — Encounter: Payer: Self-pay | Admitting: Physician Assistant

## 2021-01-16 ENCOUNTER — Ambulatory Visit
Admission: RE | Admit: 2021-01-16 | Discharge: 2021-01-16 | Disposition: A | Discharge status: Home / Self Care | Payer: Medicare HMO | Source: Ambulatory Visit | Attending: Physician Assistant | Admitting: Physician Assistant

## 2021-01-16 ENCOUNTER — Other Ambulatory Visit: Payer: Self-pay

## 2021-01-16 DIAGNOSIS — Z1231 Encounter for screening mammogram for malignant neoplasm of breast: Secondary | ICD-10-CM | POA: Diagnosis not present

## 2021-01-17 ENCOUNTER — Other Ambulatory Visit: Payer: Self-pay | Admitting: Physician Assistant

## 2021-01-17 DIAGNOSIS — I10 Essential (primary) hypertension: Secondary | ICD-10-CM

## 2021-01-19 ENCOUNTER — Other Ambulatory Visit: Payer: Self-pay | Admitting: Physician Assistant

## 2021-01-19 DIAGNOSIS — E119 Type 2 diabetes mellitus without complications: Secondary | ICD-10-CM

## 2021-01-19 DIAGNOSIS — I10 Essential (primary) hypertension: Secondary | ICD-10-CM

## 2021-02-07 ENCOUNTER — Other Ambulatory Visit: Payer: Self-pay | Admitting: Physician Assistant

## 2021-02-07 MED ORDER — ATORVASTATIN CALCIUM 20 MG PO TABS: 20.0000 mg | ORAL_TABLET | Freq: Every day | ORAL | 1 refills | Status: AC

## 2021-02-07 NOTE — Telephone Encounter (Signed)
Please contact patient to schedule lab visit for B12 for further medication refills. AS, CMA

## 2021-02-07 NOTE — Telephone Encounter (Signed)
Recommend repeating Vitamin B12 level before refilling medication.  Thank you, Herb Grays

## 2021-02-07 NOTE — Telephone Encounter (Signed)
Pt requesting refill of B12.   Last refill given 03/31/20 #90 with 1 refill. At last visit pt states she is not taking this med.   Last B12 labs obtained 09/03/19. Please advise if refill appropriate. AS, CMA

## 2021-03-23 ENCOUNTER — Other Ambulatory Visit: Payer: Self-pay | Admitting: Physician Assistant

## 2021-03-23 DIAGNOSIS — E559 Vitamin D deficiency, unspecified: Secondary | ICD-10-CM

## 2021-03-27 ENCOUNTER — Other Ambulatory Visit: Payer: Self-pay | Admitting: Physician Assistant

## 2021-03-27 DIAGNOSIS — I152 Hypertension secondary to endocrine disorders: Secondary | ICD-10-CM

## 2021-03-27 DIAGNOSIS — Z0000 Encounter for general adult medical examination without abnormal findings: Secondary | ICD-10-CM

## 2021-03-27 DIAGNOSIS — E538 Deficiency of other specified B group vitamins: Secondary | ICD-10-CM

## 2021-03-27 DIAGNOSIS — E119 Type 2 diabetes mellitus without complications: Secondary | ICD-10-CM

## 2021-03-27 DIAGNOSIS — E559 Vitamin D deficiency, unspecified: Secondary | ICD-10-CM

## 2021-03-27 DIAGNOSIS — E1159 Type 2 diabetes mellitus with other circulatory complications: Secondary | ICD-10-CM

## 2021-03-30 ENCOUNTER — Other Ambulatory Visit: Payer: Medicare HMO

## 2021-03-30 ENCOUNTER — Other Ambulatory Visit: Payer: Self-pay

## 2021-03-30 DIAGNOSIS — E559 Vitamin D deficiency, unspecified: Secondary | ICD-10-CM

## 2021-03-30 DIAGNOSIS — E538 Deficiency of other specified B group vitamins: Secondary | ICD-10-CM

## 2021-03-30 DIAGNOSIS — I152 Hypertension secondary to endocrine disorders: Secondary | ICD-10-CM | POA: Diagnosis not present

## 2021-03-30 DIAGNOSIS — Z0000 Encounter for general adult medical examination without abnormal findings: Secondary | ICD-10-CM | POA: Diagnosis not present

## 2021-03-30 DIAGNOSIS — E1159 Type 2 diabetes mellitus with other circulatory complications: Secondary | ICD-10-CM | POA: Diagnosis not present

## 2021-03-30 DIAGNOSIS — E119 Type 2 diabetes mellitus without complications: Secondary | ICD-10-CM

## 2021-03-31 LAB — HEMOGLOBIN A1C
Est. average glucose Bld gHb Est-mCnc: 148 mg/dL
Hgb A1c MFr Bld: 6.8 % — ABNORMAL HIGH (ref 4.8–5.6)

## 2021-03-31 LAB — COMPREHENSIVE METABOLIC PANEL
ALT: 25 IU/L (ref 0–32)
AST: 27 IU/L (ref 0–40)
Albumin/Globulin Ratio: 2.2 (ref 1.2–2.2)
Albumin: 4.6 g/dL (ref 3.7–4.7)
Alkaline Phosphatase: 74 IU/L (ref 44–121)
BUN/Creatinine Ratio: 21 (ref 12–28)
BUN: 16 mg/dL (ref 8–27)
Bilirubin Total: 0.5 mg/dL (ref 0.0–1.2)
CO2: 20 mmol/L (ref 20–29)
Calcium: 9.8 mg/dL (ref 8.7–10.3)
Chloride: 97 mmol/L (ref 96–106)
Creatinine, Ser: 0.77 mg/dL (ref 0.57–1.00)
Globulin, Total: 2.1 g/dL (ref 1.5–4.5)
Glucose: 118 mg/dL — ABNORMAL HIGH (ref 65–99)
Potassium: 4.3 mmol/L (ref 3.5–5.2)
Sodium: 135 mmol/L (ref 134–144)
Total Protein: 6.7 g/dL (ref 6.0–8.5)
eGFR: 79 mL/min/{1.73_m2} (ref >59)

## 2021-03-31 LAB — VITAMIN B12: Vitamin B-12: 1747 pg/mL — ABNORMAL HIGH (ref 232–1245)

## 2021-03-31 LAB — CBC
Hematocrit: 41.9 % (ref 34.0–46.6)
Hemoglobin: 13.8 g/dL (ref 11.1–15.9)
MCH: 31.2 pg (ref 26.6–33.0)
MCHC: 32.9 g/dL (ref 31.5–35.7)
MCV: 95 fL (ref 79–97)
Platelets: 200 10*3/uL (ref 150–450)
RBC: 4.42 x10E6/uL (ref 3.77–5.28)
RDW: 12.4 % (ref 11.7–15.4)
WBC: 23 10*3/uL (ref 3.4–10.8)

## 2021-03-31 LAB — VITAMIN D 25 HYDROXY (VIT D DEFICIENCY, FRACTURES): Vit D, 25-Hydroxy: 55.4 ng/mL (ref 30.0–100.0)

## 2021-03-31 LAB — LIPID PANEL
Chol/HDL Ratio: 3.1 ratio (ref 0.0–4.4)
Cholesterol, Total: 137 mg/dL (ref 100–199)
HDL: 44 mg/dL (ref >39)
LDL Chol Calc (NIH): 67 mg/dL (ref 0–99)
Triglycerides: 153 mg/dL — ABNORMAL HIGH (ref 0–149)
VLDL Cholesterol Cal: 26 mg/dL (ref 5–40)

## 2021-03-31 LAB — TSH: TSH: 1.67 u[IU]/mL (ref 0.450–4.500)

## 2021-04-02 ENCOUNTER — Ambulatory Visit: Payer: Medicare HMO | Admitting: Physician Assistant

## 2021-04-04 ENCOUNTER — Ambulatory Visit: Payer: Medicare HMO | Admitting: Gastroenterology

## 2021-04-10 ENCOUNTER — Ambulatory Visit (INDEPENDENT_AMBULATORY_CARE_PROVIDER_SITE_OTHER): Payer: Medicare HMO | Admitting: Otolaryngology

## 2021-04-19 ENCOUNTER — Other Ambulatory Visit: Payer: Self-pay | Admitting: Physician Assistant

## 2021-04-20 ENCOUNTER — Ambulatory Visit: Payer: Medicare HMO | Admitting: Physician Assistant

## 2021-04-23 ENCOUNTER — Other Ambulatory Visit: Payer: Self-pay | Admitting: Physician Assistant

## 2021-04-23 DIAGNOSIS — E559 Vitamin D deficiency, unspecified: Secondary | ICD-10-CM

## 2021-04-23 MED ORDER — VITAMIN D (ERGOCALCIFEROL) 1.25 MG (50000 UNIT) PO CAPS: 50000.0000 [IU] | ORAL_CAPSULE | ORAL | 0 refills | Status: AC

## 2021-05-03 IMAGING — CT CT HEAD W/O CM
3 of 4 series · 16 of 47 positions shown, 19 images · non-contrast
Comparison: None.

CLINICAL DATA: Headache and left facial droop

EXAM:
CT HEAD WITHOUT CONTRAST
TECHNIQUE: Contiguous axial images were obtained from the base of the skull
through the vertex without intravenous contrast.

[Series 4: head 2.0 h70h · axial · 0.44mm/px · z∈[-119,+27]mm · 10 of 83 slices shown, 13 images]
[im 5/83  brain]
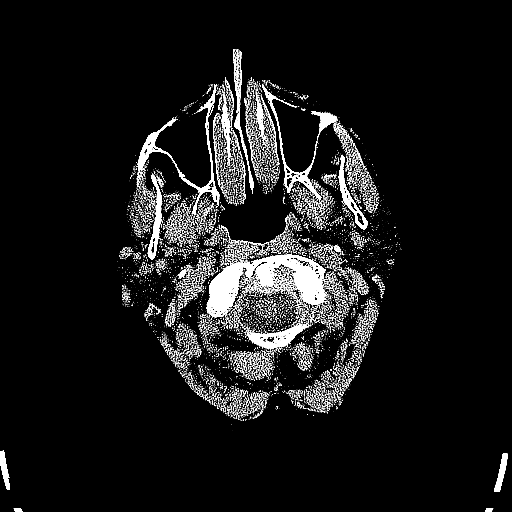
[im 5/83  bone]
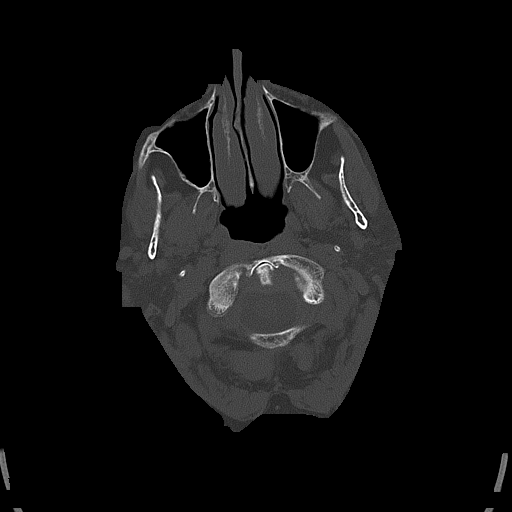
[im 13/83  brain]
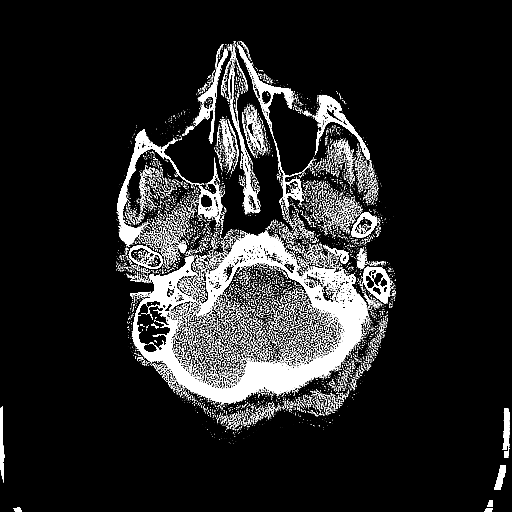
[im 21/83  brain]
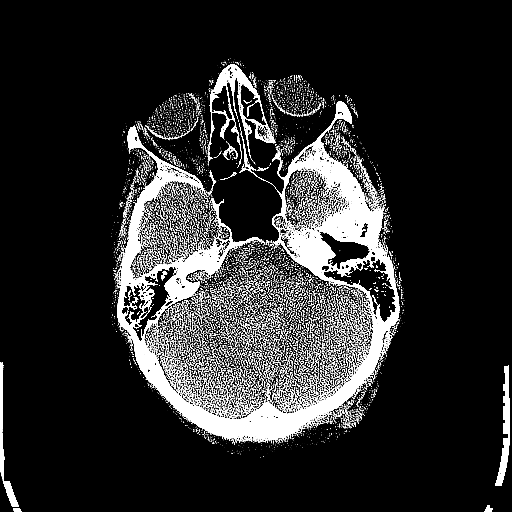
[im 29/83  brain]
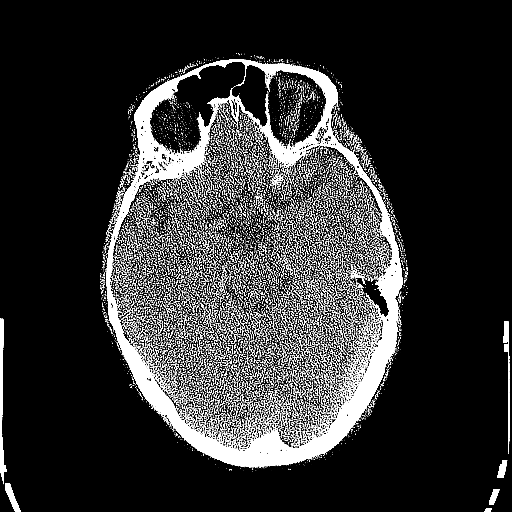
[im 37/83  brain]
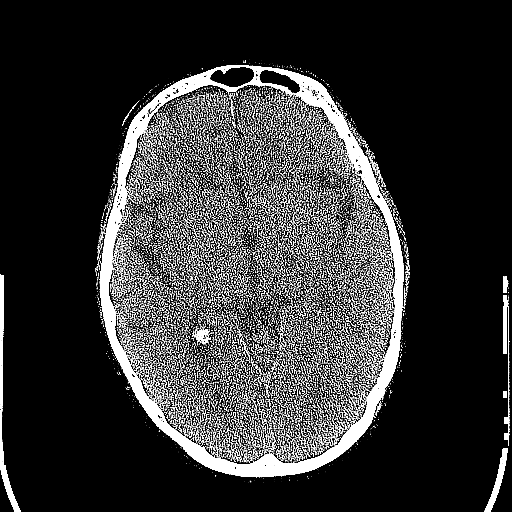
[im 37/83  bone]
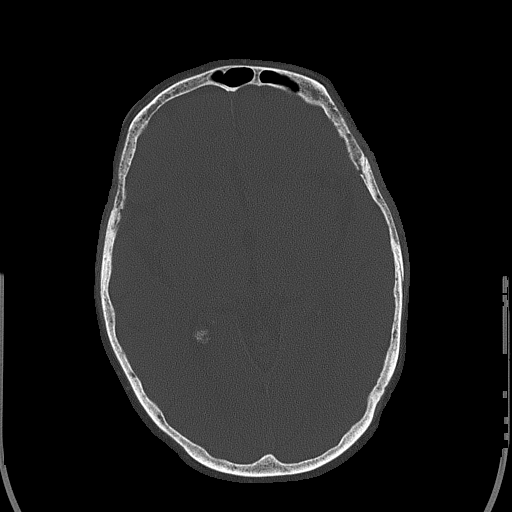
[im 46/83  brain]
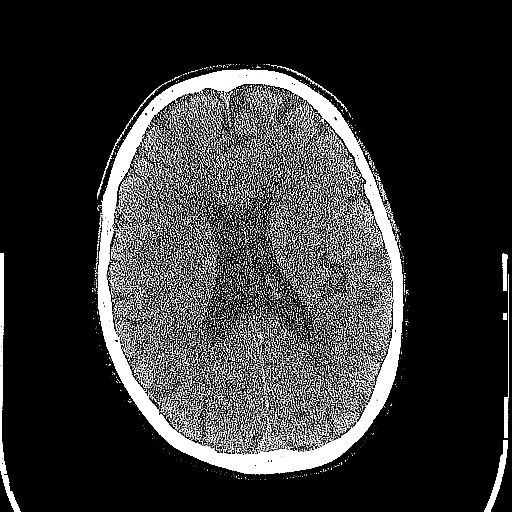
[im 54/83  brain]
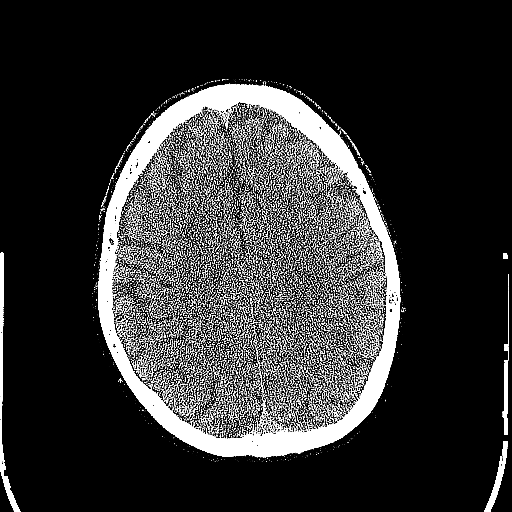
[im 62/83  brain]
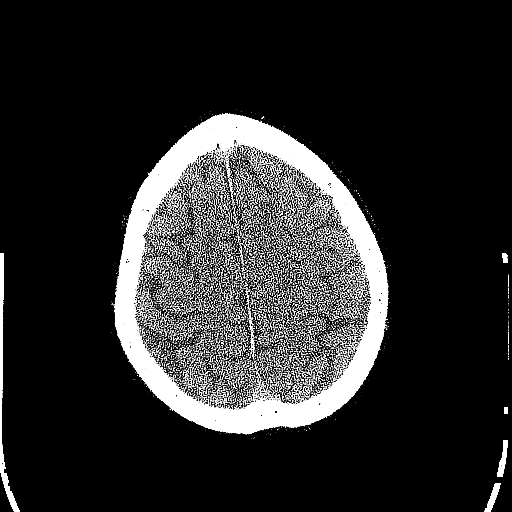
[im 70/83  brain]
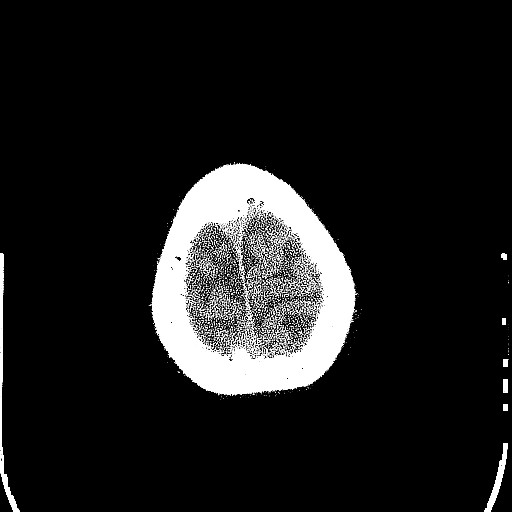
[im 70/83  bone]
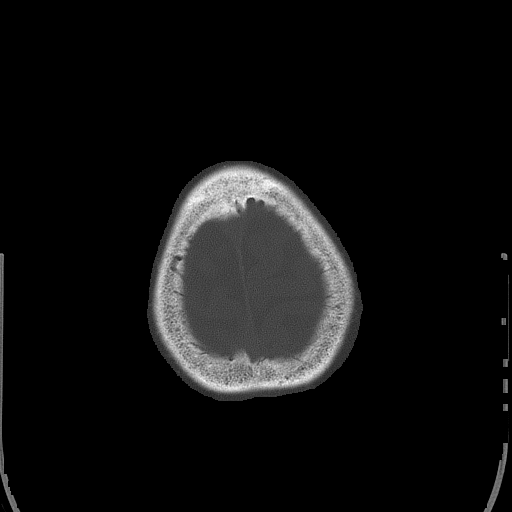
[im 78/83  brain]
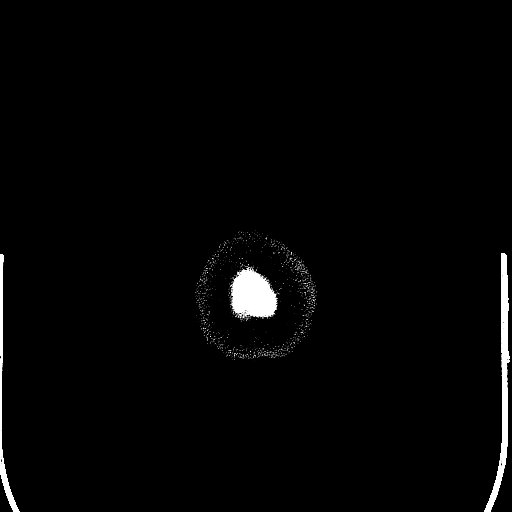

[Series 5: head 3.0 mpr cor · coronal · 0.33mm/px · 3 of 67 slices shown]
[im 23/67  brain]
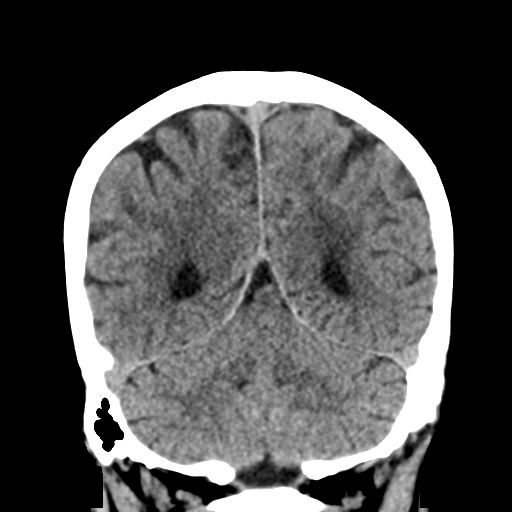
[im 30/67  brain]
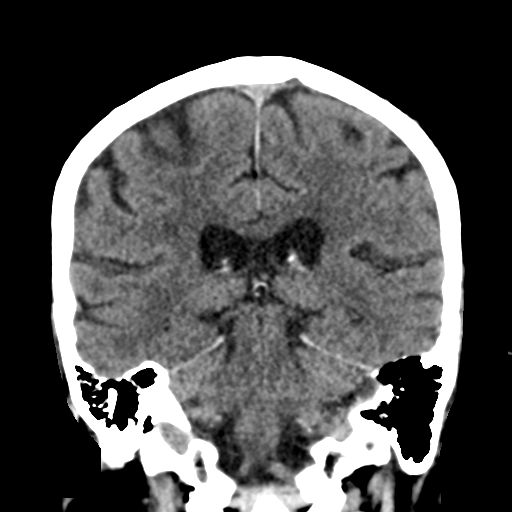
[im 37/67  brain]
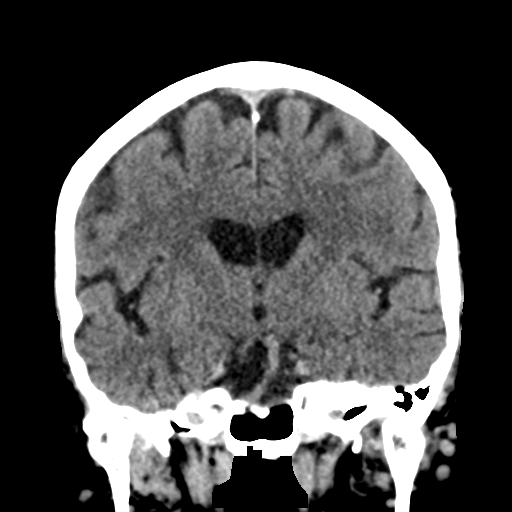

[Series 6: head 3.0 mpr sag · sagittal · 0.34mm/px · 3 of 67 slices shown]
[im 23/67  brain]
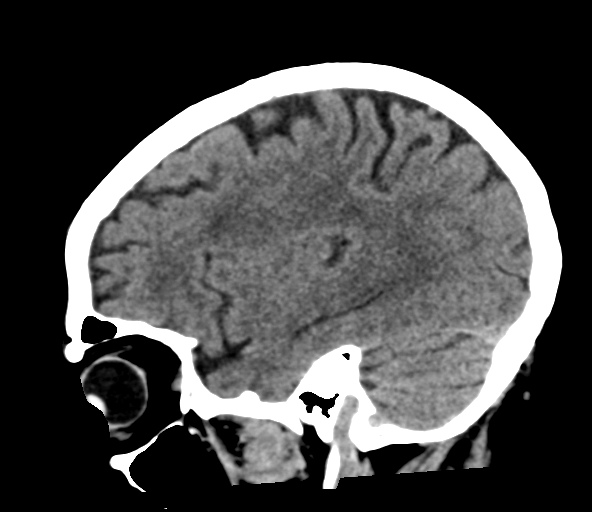
[im 34/67  brain]
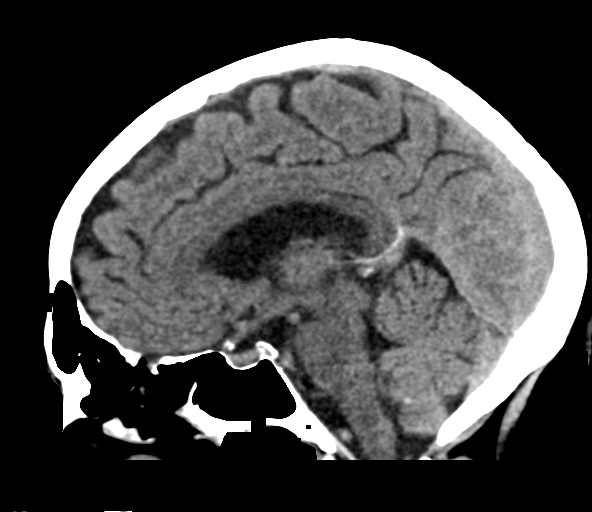
[im 45/67  brain]
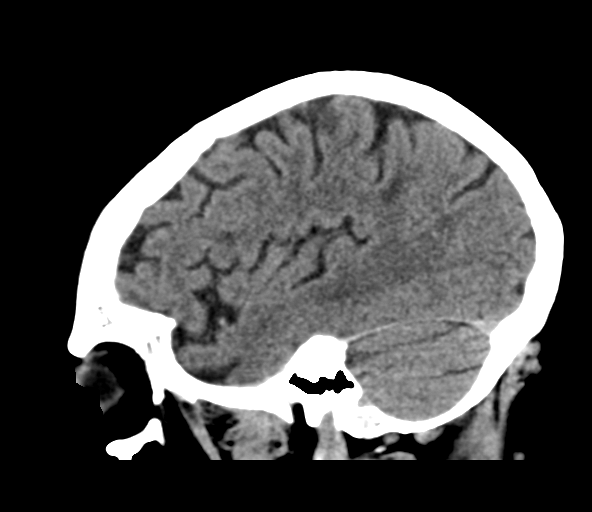

[16 of 47 positions shown; findings below may reference images not displayed]

FINDINGS: Brain: There is age related volume loss. There is no intracranial
mass, hemorrhage, extra-axial fluid collection, or midline shift.
There is patchy small vessel disease throughout the centra semiovale
bilaterally. No acute appearing infarct is evident.

Vascular: No hyperdense vessel. There is mild calcification in the
carotid siphon regions bilaterally.

Skull: Bony calvarium appears intact.

Sinuses/Orbits: There is a small retention cyst in the inferior left
maxillary antrum anteriorly. There is mucosal thickening and
opacification in multiple ethmoid air cells. Orbits appear symmetric
bilaterally.

Other: Mastoid air cells are clear.
IMPRESSION: Age related volume loss with patchy periventricular small vessel
disease bilaterally. No acute appearing infarct. No mass or
hemorrhage evident.

Mild arterial vascular calcification. Areas of paranasal sinus
disease at several sites.

## 2021-06-22 ENCOUNTER — Other Ambulatory Visit: Payer: Self-pay | Admitting: Physician Assistant

## 2021-06-22 ENCOUNTER — Other Ambulatory Visit: Payer: Self-pay | Admitting: Gastroenterology

## 2021-06-22 DIAGNOSIS — E119 Type 2 diabetes mellitus without complications: Secondary | ICD-10-CM

## 2021-06-22 DIAGNOSIS — I10 Essential (primary) hypertension: Secondary | ICD-10-CM

## 2021-06-29 ENCOUNTER — Inpatient Hospital Stay: Payer: Medicare HMO

## 2021-06-29 ENCOUNTER — Inpatient Hospital Stay: Payer: Medicare HMO | Admitting: Family

## 2021-08-19 ENCOUNTER — Other Ambulatory Visit: Payer: Self-pay | Admitting: Physician Assistant

## 2021-08-21 ENCOUNTER — Ambulatory Visit: Payer: Medicare HMO | Admitting: Gastroenterology

## 2021-08-27 ENCOUNTER — Telehealth: Payer: Self-pay | Admitting: Physician Assistant

## 2021-08-27 ENCOUNTER — Other Ambulatory Visit: Payer: Self-pay | Admitting: Physician Assistant

## 2021-08-27 DIAGNOSIS — I10 Essential (primary) hypertension: Secondary | ICD-10-CM

## 2021-08-27 MED ORDER — HYDROCHLOROTHIAZIDE 25 MG PO TABS: ORAL_TABLET | ORAL | 0 refills | Status: AC

## 2021-08-27 NOTE — Telephone Encounter (Signed)
Patient has recently moved from [place] to [place] then from [place] to [place] where she will be looking for a new PCP.   Patient is requesting refill of HCTZ. AS, CMA

## 2021-08-27 NOTE — Telephone Encounter (Signed)
Refill sent to pharmacy. AS, CMA
